# Patient Record
Sex: Male | Born: 1940 | Race: White | Hispanic: No | Smoking: Former smoker
Health system: Southern US, Community
[De-identification: ages and names within clinical notes are randomized; demographics above are authoritative.]

## PROBLEM LIST (undated history)

## (undated) DIAGNOSIS — Z992 Dependence on renal dialysis: Secondary | ICD-10-CM

## (undated) DIAGNOSIS — I10 Essential (primary) hypertension: Secondary | ICD-10-CM

## (undated) DIAGNOSIS — N19 Unspecified kidney failure: Secondary | ICD-10-CM

## (undated) DIAGNOSIS — I2510 Atherosclerotic heart disease of native coronary artery without angina pectoris: Secondary | ICD-10-CM

## (undated) HISTORY — PX: TONSILLECTOMY: SUR1361

## (undated) HISTORY — PX: BELOW KNEE LEG AMPUTATION: SUR23

---

## 2008-07-06 ENCOUNTER — Ambulatory Visit: Payer: Self-pay | Admitting: Diagnostic Radiology

## 2008-07-06 ENCOUNTER — Ambulatory Visit (HOSPITAL_BASED_OUTPATIENT_CLINIC_OR_DEPARTMENT_OTHER): Admission: RE | Admit: 2008-07-06 | Discharge: 2008-07-06 | Payer: Self-pay | Admitting: Family Medicine

## 2010-12-31 ENCOUNTER — Encounter: Payer: Self-pay | Admitting: *Deleted

## 2010-12-31 ENCOUNTER — Emergency Department (HOSPITAL_BASED_OUTPATIENT_CLINIC_OR_DEPARTMENT_OTHER)
Admission: EM | Admit: 2010-12-31 | Discharge: 2010-12-31 | Disposition: A | Discharge status: Home / Self Care | Payer: Medicare Other | Attending: Emergency Medicine | Admitting: Emergency Medicine

## 2010-12-31 DIAGNOSIS — E119 Type 2 diabetes mellitus without complications: Secondary | ICD-10-CM | POA: Insufficient documentation

## 2010-12-31 DIAGNOSIS — I10 Essential (primary) hypertension: Secondary | ICD-10-CM | POA: Insufficient documentation

## 2010-12-31 DIAGNOSIS — N179 Acute kidney failure, unspecified: Secondary | ICD-10-CM

## 2010-12-31 DIAGNOSIS — R944 Abnormal results of kidney function studies: Secondary | ICD-10-CM | POA: Insufficient documentation

## 2010-12-31 HISTORY — DX: Essential (primary) hypertension: I10

## 2010-12-31 HISTORY — DX: Unspecified kidney failure: N19

## 2010-12-31 NOTE — ED Provider Notes (Signed)
History     Chief Complaint  Patient presents with  . Abnormal Lab   HPI Comments: Pt was sent to ED for evaluation of creatine of 10.1.  Pt had a checkup with his MD and creatine was 10.1.  Pt does home peritoneal dialysis.  Pt reports he feels well.  No complaints.  Pt reports he has been shopping and has not had shortness of breath. Pt denies any swelling.  Pt reports no fever,  Fluid has been clear.  Patient is a 70 y.o. male presenting with general illness. The history is provided by the patient.  Illness  The current episode started today. Pertinent negatives include no abdominal pain, no nausea and no vomiting.    Past Medical History  Diagnosis Date  . Renal failure   . Diabetes mellitus   . Hypertension     History reviewed. No pertinent past surgical history.  History reviewed. No pertinent family history.  History  Substance Use Topics  . Smoking status: Never Smoker   . Smokeless tobacco: Not on file  . Alcohol Use: No      Review of Systems  Respiratory: Negative for shortness of breath.   Cardiovascular: Negative for chest pain and leg swelling.  Gastrointestinal: Negative for nausea, vomiting and abdominal pain.    Physical Exam  BP 156/79  Pulse 70  Temp(Src) 98.8 F (37.1 C) (Oral)  Resp 16  Wt 180 lb (81.647 kg)  SpO2 100%  Physical Exam  Constitutional: He is oriented to person, place, and time. He appears well-developed and well-nourished.  HENT:  Head: Normocephalic.  Cardiovascular: Normal rate.   Pulmonary/Chest: Effort normal.  Abdominal: Soft.  Musculoskeletal: Normal range of motion.  Neurological: He is alert and oriented to person, place, and time.  Skin: Skin is warm and dry.    ED Course  Procedures  MDM I spoke to the nephrologist on call for DrZehan  Who advised pt does not need repeat lab,  She advised have pt call her if any problems,       Langston Masker, [place] 12/31/10 1735

## 2010-12-31 NOTE — ED Notes (Signed)
Pt sent here from PMD office for increased creatine level 10.1, peritoneal dialysis pt

## 2011-11-12 ENCOUNTER — Encounter (HOSPITAL_BASED_OUTPATIENT_CLINIC_OR_DEPARTMENT_OTHER): Payer: Self-pay | Admitting: *Deleted

## 2011-11-12 ENCOUNTER — Emergency Department (HOSPITAL_BASED_OUTPATIENT_CLINIC_OR_DEPARTMENT_OTHER)
Admission: EM | Admit: 2011-11-12 | Discharge: 2011-11-12 | Disposition: A | Discharge status: Home / Self Care | Payer: Medicare Other | Attending: Emergency Medicine | Admitting: Emergency Medicine

## 2011-11-12 ENCOUNTER — Emergency Department (HOSPITAL_BASED_OUTPATIENT_CLINIC_OR_DEPARTMENT_OTHER): Payer: Medicare Other

## 2011-11-12 DIAGNOSIS — IMO0001 Reserved for inherently not codable concepts without codable children: Secondary | ICD-10-CM | POA: Insufficient documentation

## 2011-11-12 DIAGNOSIS — M778 Other enthesopathies, not elsewhere classified: Secondary | ICD-10-CM

## 2011-11-12 DIAGNOSIS — M79609 Pain in unspecified limb: Secondary | ICD-10-CM | POA: Insufficient documentation

## 2011-11-12 DIAGNOSIS — M7989 Other specified soft tissue disorders: Secondary | ICD-10-CM | POA: Insufficient documentation

## 2011-11-12 DIAGNOSIS — E119 Type 2 diabetes mellitus without complications: Secondary | ICD-10-CM | POA: Insufficient documentation

## 2011-11-12 DIAGNOSIS — M65849 Other synovitis and tenosynovitis, unspecified hand: Secondary | ICD-10-CM | POA: Insufficient documentation

## 2011-11-12 DIAGNOSIS — N289 Disorder of kidney and ureter, unspecified: Secondary | ICD-10-CM | POA: Insufficient documentation

## 2011-11-12 DIAGNOSIS — I10 Essential (primary) hypertension: Secondary | ICD-10-CM | POA: Insufficient documentation

## 2011-11-12 DIAGNOSIS — M65839 Other synovitis and tenosynovitis, unspecified forearm: Secondary | ICD-10-CM | POA: Insufficient documentation

## 2011-11-12 NOTE — ED Notes (Signed)
Woke with pain in his right hand. No known injury. Bruising noted.

## 2011-11-12 NOTE — ED Provider Notes (Signed)
History     CSN: [id number]  Arrival date & time 11/12/11  1423   First MD Initiated Contact with Patient 11/12/11 1556      No chief complaint on file.   (Consider location/radiation/quality/duration/timing/severity/associated sxs/prior treatment) Patient is a 71 y.o. male presenting with hand pain. The history is provided by the patient. No language interpreter was used.  Hand Pain This is a new problem. The current episode started today. The problem occurs constantly. The problem has been gradually worsening. Associated symptoms include joint swelling and myalgias. The symptoms are aggravated by nothing. He has tried nothing for the symptoms. The treatment provided moderate relief.  Pt complains of pain to his right hand and right thumb.  Past Medical History  Diagnosis Date  . Renal failure   . Diabetes mellitus   . Hypertension     History reviewed. No pertinent past surgical history.  No family history on file.  History  Substance Use Topics  . Smoking status: Never Smoker   . Smokeless tobacco: Not on file  . Alcohol Use: No      Review of Systems  Musculoskeletal: Positive for myalgias and joint swelling.  All other systems reviewed and are negative.    Allergies  Penicillins and Ramipril  Home Medications   Current Outpatient Rx  Name Route Sig Dispense Refill  . VITAMIN C 100 MG PO TABS Oral Take 100 mg by mouth every evening.      . ASPIRIN 81 MG PO TBEC Oral Take 81 mg by mouth every evening.      . ATORVASTATIN CALCIUM 80 MG PO TABS Oral Take 80 mg by mouth every evening.      Marland Kitchen CALCITRIOL 0.25 MCG PO CAPS Oral Take 0.25-0.5 mcg by mouth daily. Take 2 tabs Mon-Sat and take 1 tab on Sun     . CALCIUM CARBONATE ANTACID 1000 MG PO CHEW Oral Chew 1,000 mg by mouth 3 (three) times daily.      Marland Kitchen CLONIDINE HCL 0.3 MG/24HR TD PTWK Transdermal Place 1 patch onto the skin once a week. Change every Mon     . CLOPIDOGREL BISULFATE 75 MG PO TABS Oral Take 75  mg by mouth every evening.      Marland Kitchen DOXAZOSIN MESYLATE 2 MG PO TABS Oral Take 2 mg by mouth daily.      . OMEGA-3 FATTY ACIDS 1000 MG PO CAPS Oral Take 1 g by mouth 2 (two) times daily.      . FUROSEMIDE 40 MG PO TABS Oral Take 40 mg by mouth daily.      Marland Kitchen GLIPIZIDE ER 5 MG PO TB24 Oral Take 5 mg by mouth daily.      Marland Kitchen HYDRALAZINE HCL 25 MG PO TABS Oral Take 25 mg by mouth 3 (three) times daily.      . INSULIN GLARGINE 100 UNIT/ML [place] SOLN Subcutaneous Inject 20 Units into the skin at bedtime.      Marland Kitchen LABETALOL HCL 200 MG PO TABS Oral Take 200 mg by mouth 2 (two) times daily. Take 1 & 1/2 tabs in the morning and 1 tab in the evening     . MULTIVITAL PO Oral Take 1 tablet by mouth daily.      Marland Kitchen VITAMIN E 400 UNITS PO CAPS Oral Take 400 Units by mouth every evening.      Marland Kitchen ZINC 50 MG PO CAPS Oral Take 1 capsule by mouth every evening.        BP  166/74  Pulse 78  Temp(Src) 98.3 F (36.8 C) (Oral)  Resp 20  Ht 5\' 4"  (1.626 m)  Wt 178 lb (80.74 kg)  BMI 30.55 kg/m2  SpO2 100%  Physical Exam  Nursing note and vitals reviewed. Constitutional: He is oriented to person, place, and time. He appears well-developed and well-nourished.  HENT:  Head: Normocephalic and atraumatic.  Musculoskeletal: He exhibits tenderness.       Tender right thumb,  Pain with loading,  nv and ns intact  Neurological: He is alert and oriented to person, place, and time. He has normal reflexes.  Skin: Skin is warm and dry.  Psychiatric: He has a normal mood and affect.    ED Course  Procedures (including critical care time)  Labs Reviewed - No data to display Dg Hand Complete Right  11/12/2011  *RADIOLOGY REPORT*  Clinical Data: Right hand pain, no known injury, history renal failure, diabetes  RIGHT HAND - COMPLETE 3+ VIEW  Comparison: None  Findings: Osseous demineralization. Scattered narrowing of interphalangeal joints. Mild degenerative changes at first carpal-metacarpal joint and at scaphotrapezium joint.  Scattered small vessel vascular calcifications question related to renal failure and diabetes. Small sclerotic focus within the distal right ulnar diaphysis question enchondroma. No acute fracture or dislocation. Small juxta-articular erosion at fourth MCP joint.  IMPRESSION: Osseous demineralization with scattered degenerative changes of interphalangeal joints at radial border of carpus. Single small erosion identified at fourth MCP joint, cannot exclude coexistent inflammatory arthropathy. No other focal bony abnormalities seen.  Original Report Authenticated By: Lollie Marrow, M.D.     No diagnosis found.    MDM  Pt placed in thumb spica splint        Lonia Skinner [place], [place] 11/12/11 1650

## 2011-11-12 NOTE — Discharge Instructions (Signed)
Extensor Carpi Ulnaris Tendinitis with Rehab Tendonitis involves inflammation of a tendon, which is a soft tissue that connects muscle to bone. The Extensor Carpi Ulnaris (ECU) tendon is vulnerable to tendonitis. The ECU is responsible for straightening and rotating (abducting) the wrist. The ECU is important for gripping and pulling. ECU tendonitis may include inflammation of the ECU tendon lining (sheath). The tendon sheath usually secretes a fluid that allows the tendon to function smoothly, but when it becomes inflamed, function is impaired. This condition may also include a tear in the ECU tendon or muscle (strain). The strain may be classified as a grade 1 or 2 strain. Grade 1 strains cause pain, but the tendon is not lengthened. Grade 2 strains include a lengthened ligament, due to being stretched or partially ruptured. With grade 2 strains there is still function, although the function may be reduced. SYMPTOMS   Pain, tenderness, swelling, warmth, or redness on the little finger side of the wrist.   Pain that worsens when straightening the wrist or bending it toward the little finger.   Pain with gripping.   Limited motion of the wrist.   Crackling sound (crepitation) when the tendon or wrist is moved or touched.  CAUSES  ECU tendonitis is caused by injury to the ECU tendon. It is usually due to chronic or repetitive injuries, but may be due to sharp (acute) injury. Common causes of injury include:  Strain from unusual use, overuse, or increase in activity, or change in activity of the wrist, hand, or forearm.   Direct hit (trauma) to the muscles and tendon on the side of the wrist.   Repetitive motions of the hand and wrist, due to friction of the tendon within the tendon lining.  RISK INCREASES WITH:  Sports that involve repetitive hand and wrist motions (i.e. golfing, bowling).   Sports that require gripping (i.e. tennis, golf, weightlifting).   Heavy labor.   Poor wrist and  forearm strength and flexibility.   Failure to warm-up properly before activity.  PREVENTION   Warm up and stretch properly before activity.   Allow the body to recover between activities.   Maintain physical fitness:   Strength, flexibility, and endurance.   Cardiovascular fitness.   Learn and use proper exercise technique.  PROGNOSIS  If treated properly, ECU tendonitis is usually curable within 6 weeks.  RELATED COMPLICATIONS   Longer healing time, if not properly treated or if not given adequate time to heal.   Chronically inflamed tendon, causing persistent pain with activity. This may progress to constant pain, restriction of motion, and potentially tearing of the tendon.   Recurring symptoms, especially if activity is resumed too soon.   Risks of surgery: infection, bleeding, injury to nerves, continued pain, incomplete release of the tendon lining, recurring symptoms, cutting of the tendon, and weakness of the wrist and grip.  TREATMENT  Treatment initially involves the use of ice and medicine, to help reduce pain and inflammation. Performing stretching and strengthening exercises regularly is important for a quick recovery. These exercises may be completed at home or with a therapist. Your caregiver may recommend the use of a brace or splint to reduce motions that aggravate symptoms. Corticosteroid injections may be recommended. If non-surgical treatment is unsuccessful, then surgery may be needed.  MEDICATION   If pain medicine is needed, nonsteroidal anti-inflammatory medicines (aspirin and ibuprofen), or other minor pain relievers (acetaminophen), are often recommended.   Do not take pain medicine for 7 days before surgery.     Prescription pain relievers may be given if your caregiver thinks they are needed. Use only as directed and only as much as you need.   Corticosteroid injections may be given to reduce inflammation. However, these injections should be reserved  for serious cases, as they may only be given a certain number of times.  COLD THERAPY  Cold treatment (icing) relieves pain and reduces inflammation. Cold treatment should be applied for 10 to 15 minutes every 2 to 3 hours, and immediately after activity that aggravates your symptoms. Use ice packs or an ice massage. SEEK MEDICAL CARE IF:   Symptoms get worse or do not improve in 2 weeks, despite treatment.   You experience pain, numbness, or coldness in the hand.   Blue, gray, or dark color appears in the fingernails.   Any of the following occur after surgery: increased pain, swelling, redness, drainage of fluids, bleeding in the surgical area, or signs of infection.   New, unexplained symptoms develop. (Drugs used in treatment may produce side effects.)  EXERCISES RANGE OF MOTION (ROM) AND STRETCHING EXERCISES - Extensor Carpi Ulnaris Tendinitis These are some of the initial exercises you may start your recovery program with, until you see your caregiver again or until your symptoms are resolved. Remember:  Flexible tissue is more tolerant of the stresses placed on it during activity.   Each stretch should be held for 20 to 30 seconds.   A gentle stretching sensation should be felt.  RANGE OF MOTION - Wrist Flexion  Hold your right / left wrist with the fingers pointing down toward the floor.   Pull down on the wrist until you feel a stretch.   Hold this position for __________ seconds. Repeat exercise __________ times, __________ times per day.   This exercise should be done with the elbow:  _____ Bent to 90 degrees. _____ Straight. RANGE OF MOTION - Wrist Flexion  Place the back of your right / left hand flat on the top of a table. Your shoulder should be turned in and your fingers facing away from your body.   Press down, bending your wrist and straightening your elbow until your feel a stretch.   Hold this position for __________ seconds.   Repeat exercise __________  times, __________ times per day.  STRENGTHENING EXERCISES - Extensor Carpi Ulnaris Tendinitis These are some of the initial exercises you may start your recovery program with, until you see your caregiver again or until your symptoms are resolved. Remember:  Strong muscles with good endurance tolerate stress better.   Do the exercises as initially prescribed by your caregiver. Progress slowly with each exercise, gradually increasing the number of repetitions and weight used, only as guided.  RANGE OF MOTION - Wrist Extensors  Sit or stand with your forearm supported.   Using a __________ weight or a piece of rubber band or tubing, bend your wrist slowly upward toward you.   Hold this position for __________ seconds and then slowly lower the wrist back to the starting position.   Repeat exercise __________ times, __________ times per day.  RANGE OF MOTION - Wrist, Ulnar Deviation  Stand with a hammer in your hand, or sit while holding onto a rubber band or tubing with both hands, and with your arm supported on a table.   Raise your hand with the hammer upward behind you, or pull down on the rubber tubing.   Hold this position for __________ seconds and then slowly lower the wrist back to   the starting position.   Repeat exercise __________ times, __________ times per day.  STRENGTH - Grip  Hold a wad of putty, soft modeling clay, a large sponge, a soft rubber ball or a soft tennis ball in your hand.   Squeeze as hard as you can.   Hold this position for __________ seconds.   Repeat exercise __________ times, __________ times per day.  Document Released: 06/08/2005 Document Revised: 05/28/2011 Document Reviewed: 09/20/2008 [employer] Patient Information 2012 [employer]. 

## 2011-11-13 NOTE — ED Provider Notes (Signed)
Medical screening examination/treatment/procedure(s) were performed by non-physician practitioner and as supervising physician I was immediately available for consultation/collaboration.   Nat Christen, MD 11/13/11 0005

## 2016-06-12 ENCOUNTER — Emergency Department (HOSPITAL_BASED_OUTPATIENT_CLINIC_OR_DEPARTMENT_OTHER)
Admission: EM | Admit: 2016-06-12 | Discharge: 2016-06-12 | Disposition: A | Discharge status: Other Acute Inpatient Hospital | Payer: Medicare Other | Attending: Emergency Medicine | Admitting: Emergency Medicine

## 2016-06-12 ENCOUNTER — Encounter (HOSPITAL_BASED_OUTPATIENT_CLINIC_OR_DEPARTMENT_OTHER): Payer: Self-pay | Admitting: *Deleted

## 2016-06-12 ENCOUNTER — Emergency Department (HOSPITAL_BASED_OUTPATIENT_CLINIC_OR_DEPARTMENT_OTHER): Payer: Medicare Other

## 2016-06-12 DIAGNOSIS — S79922A Unspecified injury of left thigh, initial encounter: Secondary | ICD-10-CM | POA: Diagnosis present

## 2016-06-12 DIAGNOSIS — Z79899 Other long term (current) drug therapy: Secondary | ICD-10-CM | POA: Diagnosis not present

## 2016-06-12 DIAGNOSIS — Z87891 Personal history of nicotine dependence: Secondary | ICD-10-CM | POA: Insufficient documentation

## 2016-06-12 DIAGNOSIS — Z992 Dependence on renal dialysis: Secondary | ICD-10-CM | POA: Diagnosis not present

## 2016-06-12 DIAGNOSIS — E1122 Type 2 diabetes mellitus with diabetic chronic kidney disease: Secondary | ICD-10-CM | POA: Insufficient documentation

## 2016-06-12 DIAGNOSIS — S72002A Fracture of unspecified part of neck of left femur, initial encounter for closed fracture: Secondary | ICD-10-CM | POA: Diagnosis not present

## 2016-06-12 DIAGNOSIS — Z794 Long term (current) use of insulin: Secondary | ICD-10-CM | POA: Diagnosis not present

## 2016-06-12 DIAGNOSIS — Y9389 Activity, other specified: Secondary | ICD-10-CM | POA: Diagnosis not present

## 2016-06-12 DIAGNOSIS — Z7982 Long term (current) use of aspirin: Secondary | ICD-10-CM | POA: Insufficient documentation

## 2016-06-12 DIAGNOSIS — W1839XA Other fall on same level, initial encounter: Secondary | ICD-10-CM | POA: Diagnosis not present

## 2016-06-12 DIAGNOSIS — Y999 Unspecified external cause status: Secondary | ICD-10-CM | POA: Diagnosis not present

## 2016-06-12 DIAGNOSIS — Y929 Unspecified place or not applicable: Secondary | ICD-10-CM | POA: Diagnosis not present

## 2016-06-12 DIAGNOSIS — N186 End stage renal disease: Secondary | ICD-10-CM | POA: Diagnosis not present

## 2016-06-12 DIAGNOSIS — I120 Hypertensive chronic kidney disease with stage 5 chronic kidney disease or end stage renal disease: Secondary | ICD-10-CM | POA: Diagnosis not present

## 2016-06-12 HISTORY — DX: Dependence on renal dialysis: Z99.2

## 2016-06-12 LAB — CBC WITH DIFFERENTIAL/PLATELET
BASOS ABS: 0.1 10*3/uL (ref 0.0–0.1)
Basophils Relative: 1 %
Eosinophils Absolute: 1.6 10*3/uL — ABNORMAL HIGH (ref 0.0–0.7)
Eosinophils Relative: 18 %
HEMATOCRIT: 32 % — AB (ref 39.0–52.0)
HEMOGLOBIN: 10.4 g/dL — AB (ref 13.0–17.0)
LYMPHS PCT: 10 %
Lymphs Abs: 0.9 10*3/uL (ref 0.7–4.0)
MCH: 30.7 pg (ref 26.0–34.0)
MCHC: 32.5 g/dL (ref 30.0–36.0)
MCV: 94.4 fL (ref 78.0–100.0)
MONO ABS: 0.6 10*3/uL (ref 0.1–1.0)
Monocytes Relative: 7 %
NEUTROS ABS: 5.8 10*3/uL (ref 1.7–7.7)
NEUTROS PCT: 64 %
Platelets: 201 10*3/uL (ref 150–400)
RBC: 3.39 MIL/uL — AB (ref 4.22–5.81)
RDW: 14.1 % (ref 11.5–15.5)
WBC: 9 10*3/uL (ref 4.0–10.5)

## 2016-06-12 LAB — BASIC METABOLIC PANEL
ANION GAP: 12 (ref 5–15)
BUN: 16 mg/dL (ref 6–20)
CO2: 31 mmol/L (ref 22–32)
Calcium: 8.9 mg/dL (ref 8.9–10.3)
Chloride: 94 mmol/L — ABNORMAL LOW (ref 101–111)
Creatinine, Ser: 3.78 mg/dL — ABNORMAL HIGH (ref 0.61–1.24)
GFR calc Af Amer: 17 mL/min — ABNORMAL LOW (ref >60)
GFR, EST NON AFRICAN AMERICAN: 15 mL/min — AB (ref >60)
GLUCOSE: 85 mg/dL (ref 65–99)
POTASSIUM: 3.4 mmol/L — AB (ref 3.5–5.1)
Sodium: 137 mmol/L (ref 135–145)

## 2016-06-12 LAB — PROTIME-INR
INR: 1.11
Prothrombin Time: 14.3 seconds (ref 11.4–15.2)

## 2016-06-12 MED ORDER — HYDROMORPHONE HCL 1 MG/ML IJ SOLN
0.5000 mg | INTRAMUSCULAR | Status: DC | PRN
  Filled 2016-06-12: qty 1

## 2016-06-12 MED ORDER — MORPHINE SULFATE (PF) 4 MG/ML IV SOLN
4.0000 mg | INTRAVENOUS | Status: DC | PRN
  Administered 2016-06-12: 4 mg via INTRAVENOUS
  Filled 2016-06-12: qty 1

## 2016-06-12 NOTE — ED Provider Notes (Signed)
[facility]   CSN: [id number] Arrival date & time: 06/12/16  1350     History   Chief Complaint Chief Complaint  Patient presents with  . Fall    HPI Todd Coleman is a 75 y.o. male.  HPI  75 -year-old male with a history of diabetes, hypertension, CAD with stent, and end-stage renal disease on dialysis presents with left leg pain after a fall 2 days ago. He was using his walker and stood up and lost his balance and fell backwards. He landed on his left leg. He has walked once since then and had a very hard time. Anytime he tries to get up it seems to hurt worse. Better at rest. Otherwise he has been using his scooter. 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 Pain is okay at this time, last pain medicine this morning and it was Tylenol. No other injuries. Last did dialysis today.  Past Medical History:  Diagnosis Date  . Diabetes mellitus   . Dialysis patient (HCC)   . Hypertension   . Renal failure     There are no active problems to display for this patient.   Past Surgical History:  Procedure Laterality Date  . BELOW KNEE LEG AMPUTATION    . CARDIAC CATHETERIZATION    . TONSILLECTOMY         Home Medications    Prior to Admission medications   Medication Sig Start Date End Date Taking? Authorizing Provider  Ascorbic Acid (VITAMIN C) 100 MG tablet Take 100 mg by mouth every evening.      Historical Provider, MD  aspirin 81 MG EC tablet Take 81 mg by mouth every evening.      Historical Provider, MD  atorvastatin (LIPITOR) 80 MG tablet Take 80 mg by mouth every evening.      Historical Provider, MD  calcitRIOL (ROCALTROL) 0.25 MCG capsule Take 0.25-0.5 mcg by mouth daily. Take 2 tabs Mon-Sat and take 1 tab on Sun     Historical Provider, MD  calcium elemental as carbonate (TUMS ULTRA 1000) 400 MG tablet Chew 1,000 mg by mouth 3 (three) times daily.      Historical Provider, MD  cloNIDine (CATAPRES - DOSED IN MG/24 HR) 0.3  mg/24hr Place 1 patch onto the skin once a week. Change every Mon     Historical Provider, MD  clopidogrel (PLAVIX) 75 MG tablet Take 75 mg by mouth every evening.      Historical Provider, MD  doxazosin (CARDURA) 2 MG tablet Take 2 mg by mouth daily.      Historical Provider, MD  fish oil-omega-3 fatty acids 1000 MG capsule Take 1 g by mouth 2 (two) times daily.      Historical Provider, MD  furosemide (LASIX) 40 MG tablet Take 40 mg by mouth daily.      Historical Provider, MD  glipiZIDE (GLUCOTROL XL) 5 MG 24 hr tablet Take 5 mg by mouth daily.      Historical Provider, MD  hydrALAZINE (APRESOLINE) 25 MG tablet Take 25 mg by mouth 3 (three) times daily.      Historical Provider, MD  insulin glargine (LANTUS SOLOSTAR) 100 UNIT/ML injection Inject 20 Units into the skin at bedtime.      Historical Provider, MD  labetalol (NORMODYNE) 200 MG tablet Take 200 mg by mouth 2 (two) times daily. Take 1 & 1/2 tabs in the morning and 1 tab in the evening     Historical Provider, MD  Multiple  Vitamins-Minerals (MULTIVITAL PO) Take 1 tablet by mouth daily.      Historical Provider, MD  vitamin E 400 UNIT capsule Take 400 Units by mouth every evening.      Historical Provider, MD  Zinc 50 MG CAPS Take 1 capsule by mouth every evening.      Historical Provider, MD    Family History No family history on file.  Social History Social History  Substance Use Topics  . Smoking status: Former Games developer  . Smokeless tobacco: Not on file  . Alcohol use No     Allergies   Dilaudid [hydromorphone hcl]; Penicillins; and Ramipril   Review of Systems Review of Systems  Respiratory: Negative for shortness of breath.   Cardiovascular: Negative for chest pain.  Musculoskeletal: Positive for arthralgias.  All other systems reviewed and are negative.    Physical Exam Updated Vital Signs BP 168/69 (BP Location: Right Arm)   Pulse 73   Temp 97.7 F (36.5 C)   Resp 17   Ht 5\' 6"  (1.676 m)   Wt 180 lb (81.6  kg)   SpO2 96%   BMI 29.05 kg/m   Physical Exam  Constitutional: He is oriented to person, place, and time. He appears well-developed and well-nourished.  HENT:  Head: Normocephalic and atraumatic.  Right Ear: External ear normal.  Left Ear: External ear normal.  Nose: Nose normal.  Eyes: Right eye exhibits no discharge. Left eye exhibits no discharge.  Neck: Neck supple.  Cardiovascular: Normal rate, regular rhythm and normal heart sounds.   Pulmonary/Chest: Effort normal and breath sounds normal.  Abdominal: He exhibits no distension.  Musculoskeletal: He exhibits no edema.       Left hip: He exhibits decreased range of motion and tenderness.  Bilateral AKA Mild left hip tenderness, most pain is when doing ROM of left hip  Neurological: He is alert and oriented to person, place, and time.  Skin: Skin is warm and dry.  Nursing note and vitals reviewed.    ED Treatments / Results  Labs (all labs ordered are listed, but only abnormal results are displayed) Labs Reviewed  BASIC METABOLIC PANEL - Abnormal; Notable for the following:       Result Value   Potassium 3.4 (*)    Chloride 94 (*)    Creatinine, Ser 3.78 (*)    GFR calc non Af Amer 15 (*)    GFR calc Af Amer 17 (*)    All other components within normal limits  CBC WITH DIFFERENTIAL/PLATELET - Abnormal; Notable for the following:    RBC 3.39 (*)    Hemoglobin 10.4 (*)    HCT 32.0 (*)    Eosinophils Absolute 1.6 (*)    All other components within normal limits  PROTIME-INR    EKG  EKG Interpretation None       Radiology Dg Hip Unilat W Or Wo Pelvis 1 View Left  Result Date: 06/12/2016 CLINICAL DATA:  Fall. EXAM: DG HIP (WITH OR WITHOUT PELVIS) 1V*L* COMPARISON:  Prior CT report 09/28/2011. FINDINGS: Diffuse osteopenia degenerative change. Left femoral neck fracture is noted. Minimal displacement. Aortoiliac atherosclerotic vascular calcification. Surgical clips noted throughout the pelvis. Calcified  granulomas noted . IMPRESSION: Slightly displaced left femoral neck fracture. Electronically Signed   By: Maisie Fus  Register   On: 06/12/2016 15:11    Procedures Procedures (including critical care time)  Medications Ordered in ED Medications  morphine 4 MG/ML injection 4 mg (4 mg Intravenous Given 06/12/16 1557)  Initial Impression / Assessment and Plan / ED Course  I have reviewed the triage vital signs and the nursing notes.  Pertinent labs & imaging results that were available during my care of the patient were reviewed by me and considered in my medical decision making (see chart for details).  Clinical Course as of Jun 13 1743  Fri Jun 12, 2016  1536 Consult ortho at [facility] per patient preference for his left hip fracture.  [SG]  1549 D/[facility]Lucas[facility] He will work on finding out about beds at the hospital and call back, likely transfer patient to ER per his preference  [SG]  1600 Dr. Doylene CanningGold, hospitalist[facility] will call back with bed  [SG]    Clinical Course User Index [SG] Pricilla LovelessScott Reeve Mallo, MD    Final Clinical Impressions(s) / ED Diagnoses   Final diagnoses:  Closed displaced fracture of left femoral neck [facility](HCC)    New Prescriptions Discharge Medication List as of 06/12/2016  5:40 PM       Pricilla LovelessScott Myalynn Lingle, MD 06/12/16 1744

## 2016-06-12 NOTE — ED Notes (Signed)
ED Provider at bedside. 

## 2016-06-12 NOTE — ED Triage Notes (Signed)
Pt c/o from standing on tile floor x 3 days ago , c/o left hip pain

## 2016-06-12 NOTE — ED Notes (Signed)
Oxygen saturation 88% on RA.  Patient placed on 2L via [place]-oxygen saturation @94 %

## 2016-06-12 NOTE — ED Triage Notes (Signed)
Nurse first-pt was assisted from front car seat to w/c to ED WR-pt was able to stand on bilat prostheses-NAD

## 2016-08-01 ENCOUNTER — Emergency Department (HOSPITAL_BASED_OUTPATIENT_CLINIC_OR_DEPARTMENT_OTHER)
Admission: EM | Admit: 2016-08-01 | Discharge: 2016-08-01 | Disposition: A | Discharge status: Other Acute Inpatient Hospital | Payer: Medicare Other | Attending: Emergency Medicine | Admitting: Emergency Medicine

## 2016-08-01 ENCOUNTER — Emergency Department (HOSPITAL_BASED_OUTPATIENT_CLINIC_OR_DEPARTMENT_OTHER): Payer: Medicare Other

## 2016-08-01 ENCOUNTER — Encounter (HOSPITAL_BASED_OUTPATIENT_CLINIC_OR_DEPARTMENT_OTHER): Payer: Self-pay | Admitting: *Deleted

## 2016-08-01 DIAGNOSIS — Z7982 Long term (current) use of aspirin: Secondary | ICD-10-CM | POA: Insufficient documentation

## 2016-08-01 DIAGNOSIS — Z794 Long term (current) use of insulin: Secondary | ICD-10-CM | POA: Insufficient documentation

## 2016-08-01 DIAGNOSIS — E119 Type 2 diabetes mellitus without complications: Secondary | ICD-10-CM | POA: Diagnosis not present

## 2016-08-01 DIAGNOSIS — I10 Essential (primary) hypertension: Secondary | ICD-10-CM | POA: Diagnosis not present

## 2016-08-01 DIAGNOSIS — Z87891 Personal history of nicotine dependence: Secondary | ICD-10-CM | POA: Diagnosis not present

## 2016-08-01 DIAGNOSIS — Z79899 Other long term (current) drug therapy: Secondary | ICD-10-CM | POA: Insufficient documentation

## 2016-08-01 DIAGNOSIS — R251 Tremor, unspecified: Secondary | ICD-10-CM | POA: Diagnosis present

## 2016-08-01 DIAGNOSIS — I639 Cerebral infarction, unspecified: Secondary | ICD-10-CM | POA: Diagnosis not present

## 2016-08-01 LAB — CBG MONITORING, ED
GLUCOSE-CAPILLARY: 118 mg/dL — AB (ref 65–99)
Glucose-Capillary: 83 mg/dL (ref 65–99)

## 2016-08-01 NOTE — ED Triage Notes (Addendum)
Pt from independent living facility.  States that he woke up at 4am after having a 'weird dream'.  States that his speech was slurred at that time.  Reports slight dizziness that continues at this time.  EMS was called, told pt that he was having a TIA.  Pt refused transport at that time.  14units on novolog taken at 3:45pm-for CBG of 167.  Pt is noted to be falling asleep and slightly delayed responses in triage.

## 2016-08-01 NOTE — ED Notes (Signed)
ED Provider at bedside. 

## 2016-08-01 NOTE — ED Notes (Signed)
[facility] Nurse unavailable -- left name and number with unit secretary.

## 2016-08-01 NOTE — ED Provider Notes (Signed)
[facility] Provider Note   CSN: [id number] Arrival date & time: 08/01/16  1608   By signing my name below, I, Soijett Blue, attest that this documentation has been prepared under the direction and in the presence of Rolland Porter, MD. Electronically Signed: Soijett Blue, ED Scribe. 08/01/16. 5:00 PM.  History   Chief Complaint Chief Complaint  Patient presents with  . Transient Ischemic Attack    HPI Todd Coleman is a 76 y.o. male with a PMHx of DM, HTN, dialysis patient, renal dx, who presents to the Emergency Department from [facility] complaining of possible TIA onset 4 AM this morning. Pt reports associated resolved confusion, speech difficulty, and tremors. Pt hasn't tried any medications for the relief of her symptoms. Pt notes that he woke up this morning with slurred speech. Pt reports that EMS came to evaluate the pt and informed that the pt would need to be evaluated to rule out a possible stroke, but the pt declined transport. Pt reports that his blood sugar was 71 earlier today, but was 167 PTA and he took 13 units of novolog with a meal. He denies weakness, HA, fever, and any other symptoms. Denies hx of stroke. 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 Pt notes that he had a 50 year smoking hx and quit smoking cigarettes 10 years ago.    The history is provided by the patient. No language interpreter was used.    Past Medical History:  Diagnosis Date  . Diabetes mellitus   . Dialysis patient (HCC)   . Hypertension   . Renal failure     There are no active problems to display for this patient.   Past Surgical History:  Procedure Laterality Date  . BELOW KNEE LEG AMPUTATION    . CARDIAC CATHETERIZATION    . TONSILLECTOMY         Home Medications    Prior to Admission medications   Medication Sig Start  Date End Date Taking? Authorizing Provider  torsemide (DEMADEX) 20 MG tablet Take 40 mg by mouth daily.   Yes Historical Provider, MD  Ascorbic Acid (VITAMIN C) 100 MG tablet Take 100 mg by mouth every evening.      Historical Provider, MD  aspirin 81 MG EC tablet Take 81 mg by mouth every evening.      Historical Provider, MD  atorvastatin (LIPITOR) 80 MG tablet Take 80 mg by mouth every evening.      Historical Provider, MD  calcitRIOL (ROCALTROL) 0.25 MCG capsule Take 0.25-0.5 mcg by mouth daily. Take 2 tabs Mon-Sat and take 1 tab on Sun     Historical Provider, MD  calcium elemental as carbonate (TUMS ULTRA 1000) 400 MG tablet Chew 1,000 mg by mouth 3 (three) times daily.      Historical Provider, MD  cloNIDine (CATAPRES - DOSED IN MG/24 HR) 0.3 mg/24hr Place 1 patch onto the skin once a week. Change every Mon     Historical Provider, MD  clopidogrel (PLAVIX) 75 MG tablet Take 75 mg by mouth every evening.      Historical Provider, MD  doxazosin (CARDURA) 2 MG tablet Take 2 mg by mouth daily.      Historical Provider, MD  fish oil-omega-3 fatty acids 1000 MG capsule Take 1 g by mouth 2 (two) times daily.  Historical Provider, MD  glipiZIDE (GLUCOTROL XL) 5 MG 24 hr tablet Take 5 mg by mouth daily.      Historical Provider, MD  hydrALAZINE (APRESOLINE) 25 MG tablet Take 25 mg by mouth 3 (three) times daily.      Historical Provider, MD  insulin glargine (LANTUS SOLOSTAR) 100 UNIT/ML injection Inject 20 Units into the skin at bedtime.      Historical Provider, MD  labetalol (NORMODYNE) 200 MG tablet Take 200 mg by mouth 2 (two) times daily. Take 1 & 1/2 tabs in the morning and 1 tab in the evening     Historical Provider, MD  Multiple Vitamins-Minerals (MULTIVITAL PO) Take 1 tablet by mouth daily.      Historical Provider, MD  vitamin E 400 UNIT capsule Take 400 Units by mouth every evening.      Historical Provider, MD  Zinc 50 MG CAPS Take 1 capsule by mouth every evening.      Historical  Provider, MD    Family History History reviewed. No pertinent family history.  Social History Social History  Substance Use Topics  . Smoking status: Former Games developer  . Smokeless tobacco: Not on file  . Alcohol use No     Allergies   Dilaudid [hydromorphone hcl]; Penicillins; and Ramipril   Review of Systems Review of Systems  Constitutional: Negative for appetite change, chills, diaphoresis, fatigue and fever.  HENT: Negative for mouth sores, sore throat and trouble swallowing.   Eyes: Negative for visual disturbance.  Respiratory: Negative for cough, chest tightness, shortness of breath and wheezing.   Cardiovascular: Negative for chest pain.  Gastrointestinal: Negative for abdominal distention, abdominal pain, diarrhea, nausea and vomiting.  Endocrine: Negative for polydipsia, polyphagia and polyuria.  Genitourinary: Negative for dysuria, frequency and hematuria.  Musculoskeletal: Negative for gait problem.  Skin: Negative for color change, pallor and rash.  Neurological: Positive for tremors and speech difficulty. Negative for dizziness, syncope, weakness, light-headedness and headaches.  Hematological: Does not bruise/bleed easily.  Psychiatric/Behavioral: Positive for confusion (resolved). Negative for behavioral problems.     Physical Exam Updated Vital Signs BP 169/77 (BP Location: Right Arm)   Pulse 87   Temp 98.1 F (36.7 C) (Oral)   Resp 24   Ht 5\' 5"  (1.651 m)   Wt 190 lb (86.2 kg)   SpO2 100%   BMI 31.62 kg/m   Physical Exam  Constitutional: He is oriented to person, place, and time. He appears well-developed and well-nourished. No distress.  HENT:  Head: Normocephalic.  Eyes: Conjunctivae are normal. Pupils are equal, round, and reactive to light. No scleral icterus.  Neck: Normal range of motion. Neck supple. No thyromegaly present.  Cardiovascular: Normal rate, regular rhythm and normal heart sounds.  Exam reveals no gallop and no friction rub.     No murmur heard. Sinus rhythm on monitor  Pulmonary/Chest: Effort normal and breath sounds normal. No respiratory distress. He has no wheezes. He has no rales.  Abdominal: Soft. Bowel sounds are normal. He exhibits no distension. There is no tenderness. There is no rebound.  Musculoskeletal: Normal range of motion.  Bilateral AKA  Neurological: He is alert and oriented to person, place, and time.  Dysarthria.  Cranial nerves otherwise intact. Resting tremor, BUE No pronator drift. Normal BUE sensation. Normal finger to nose. Bilateral LE amputations-able to lift against gravity.  Skin: Skin is warm and dry. No rash noted.  Psychiatric: He has a normal mood and affect. His behavior is normal.  Nursing  note and vitals reviewed.    ED Treatments / Results  DIAGNOSTIC STUDIES: Oxygen Saturation is 100% on RA, nl by my interpretation.    COORDINATION OF CARE: 4:57 PM Discussed treatment plan with pt at bedside which includes CBG, CT head, EKG, and pt agreed to plan.  5:08 PM- Consult with Dr. Pola Cornahal from [facility], who will accept the pt for admission.   Labs (all labs ordered are listed, but only abnormal results are displayed) Labs Reviewed  CBG MONITORING, ED - Abnormal; Notable for the following:       Result Value   Glucose-Capillary 118 (*)    All other components within normal limits    EKG  EKG Interpretation None       Radiology Ct Head Wo Contrast  Result Date: 08/01/2016 CLINICAL DATA:  Pt from independent living facility. States that he woke up at 4am after having a 'weird dream'. States that his speech was slurred at that time. Reports slight dizziness that continues at this time. EMS was called, told pt t.*comment was truncated* EXAM: CT HEAD WITHOUT CONTRAST TECHNIQUE: Contiguous axial images were obtained from the base of the skull through the vertex without intravenous contrast. COMPARISON:  None. FINDINGS: Brain: No acute intracranial  hemorrhage. No focal mass lesion. No CT evidence of acute infarction. No midline shift or mass effect. No hydrocephalus. Basilar cisterns are patent. There are periventricular and subcortical white matter hypodensities. Generalized cortical atrophy. Vascular: No hyperdense vessel or unexpected calcification. Skull: Normal. Negative for fracture or focal lesion. Sinuses/Orbits: Mild sinus inflammation of the RIGHT maxillary sinus. Other: None. IMPRESSION: IMPRESSION 1. No acute intracranial findings. 2. Atrophy and white matter microvascular disease. Electronically Signed   By: Genevive BiStewart  Edmunds M.D.   On: 08/01/2016 16:47    Procedures Procedures (including critical care time)  Medications Ordered in ED Medications - No data to display   Initial Impression / Assessment and Plan / ED Course  I have reviewed the triage vital signs and the nursing notes.  Pertinent labs & imaging results that were available during my care of the patient were reviewed by me and considered in my medical decision making (see chart for details).     She was subtle but persistent dysarthria. Patient has history of cardiovascular, renal vascular, and peripheral vascular disease. Likely cerebrovascular accident today. Normal CT. Discussed with hospitalist at [facility], Dr.Dahal.  Patient accepted for transfer and admission there for further workup for CVA.  Final Clinical Impressions(s) / ED Diagnoses   Final diagnoses:  Cerebrovascular accident (CVA), unspecified mechanism (HCC)    New Prescriptions New Prescriptions   No medications on file   I personally performed the services described in this documentation, which was scribed in my presence. The recorded information has been reviewed and is accurate.     Rolland PorterMark Deangleo Passage, MD 08/01/16 1725

## 2016-08-01 NOTE — ED Notes (Signed)
Patient transported to CT 

## 2017-08-11 ENCOUNTER — Other Ambulatory Visit: Payer: Self-pay

## 2017-08-11 ENCOUNTER — Emergency Department (HOSPITAL_COMMUNITY)
Admission: EM | Admit: 2017-08-11 | Discharge: 2017-08-12 | Disposition: A | Discharge status: Home / Self Care | Payer: Medicare Other | Attending: Emergency Medicine | Admitting: Emergency Medicine

## 2017-08-11 DIAGNOSIS — E1122 Type 2 diabetes mellitus with diabetic chronic kidney disease: Secondary | ICD-10-CM | POA: Insufficient documentation

## 2017-08-11 DIAGNOSIS — Z4901 Encounter for fitting and adjustment of extracorporeal dialysis catheter: Secondary | ICD-10-CM | POA: Diagnosis present

## 2017-08-11 DIAGNOSIS — Z794 Long term (current) use of insulin: Secondary | ICD-10-CM | POA: Insufficient documentation

## 2017-08-11 DIAGNOSIS — N186 End stage renal disease: Secondary | ICD-10-CM | POA: Insufficient documentation

## 2017-08-11 DIAGNOSIS — Z992 Dependence on renal dialysis: Secondary | ICD-10-CM | POA: Diagnosis not present

## 2017-08-11 DIAGNOSIS — Z79899 Other long term (current) drug therapy: Secondary | ICD-10-CM | POA: Diagnosis not present

## 2017-08-11 DIAGNOSIS — Z87891 Personal history of nicotine dependence: Secondary | ICD-10-CM | POA: Insufficient documentation

## 2017-08-11 DIAGNOSIS — R58 Hemorrhage, not elsewhere classified: Secondary | ICD-10-CM

## 2017-08-11 DIAGNOSIS — Z7901 Long term (current) use of anticoagulants: Secondary | ICD-10-CM | POA: Insufficient documentation

## 2017-08-11 DIAGNOSIS — I120 Hypertensive chronic kidney disease with stage 5 chronic kidney disease or end stage renal disease: Secondary | ICD-10-CM | POA: Diagnosis not present

## 2017-08-11 NOTE — Discharge Instructions (Signed)
Keep the dressing on overnight, you can loosen the Coban dressing if it starts to feel too tight

## 2017-08-11 NOTE — ED Provider Notes (Signed)
[facility]-EMERGENCY DEPT Provider Note   CSN: [id number] Arrival date & time: 08/11/17  2131     History   Chief Complaint Chief Complaint  Patient presents with  . Bleeding Fistula    HPI Todd Coleman is a 77 y.o. male.  HPI Patient presented to the emergency room for evaluation of bleeding from his AV fistula.  Patient had dialysis this morning.  He did not have any issues.  When he went to take off his bandage this afternoon it started bleeding.  Usually he just has to put a Band-Aid on it but it would not stop bleeding so he called EMS and was brought to the emergency room.  EMS applied a pressure dressing.  The bleeding stopped.  Patient denies any other complaints.  No chest pain.  No lightheadedness. no other complaints. Past Medical History:  Diagnosis Date  . Diabetes mellitus   . Dialysis patient (HCC)   . Hypertension   . Renal failure     There are no active problems to display for this patient.   Past Surgical History:  Procedure Laterality Date  . BELOW KNEE LEG AMPUTATION    . CARDIAC CATHETERIZATION    . TONSILLECTOMY         Home Medications    Prior to Admission medications   Medication Sig Start Date End Date Taking? Authorizing Provider  Ascorbic Acid (VITAMIN C) 100 MG tablet Take 100 mg by mouth every evening.     Yes [provider]  aspirin 81 MG EC tablet Take 81 mg by mouth every evening.     Yes [provider]  atorvastatin (LIPITOR) 80 MG tablet Take 80 mg by mouth every evening.     Yes [provider]  insulin glargine (LANTUS SOLOSTAR) 100 UNIT/ML injection Inject 20 Units into the skin at bedtime.     Yes [provider]  vitamin E 400 UNIT capsule Take 400 Units by mouth every evening.     Yes [provider]  Zinc 50 MG CAPS Take 1 capsule by mouth every evening.     Yes [provider]  calcitRIOL (ROCALTROL) 0.25 MCG capsule Take 0.25-0.5 mcg by mouth  daily. Take 2 tabs Mon-Sat and take 1 tab on Sun     [provider]  calcium elemental as carbonate (TUMS ULTRA 1000) 400 MG tablet Chew 1,000 mg by mouth 3 (three) times daily.      [provider]  cloNIDine (CATAPRES - DOSED IN MG/24 HR) 0.3 mg/24hr Place 1 patch onto the skin once a week. Change every Mon     [provider]  clopidogrel (PLAVIX) 75 MG tablet Take 75 mg by mouth every evening.      [provider]  doxazosin (CARDURA) 2 MG tablet Take 2 mg by mouth daily.      [provider]  fish oil-omega-3 fatty acids 1000 MG capsule Take 1 g by mouth 2 (two) times daily.      [provider]  glipiZIDE (GLUCOTROL XL) 5 MG 24 hr tablet Take 5 mg by mouth daily.      [provider]  hydrALAZINE (APRESOLINE) 25 MG tablet Take 25 mg by mouth 3 (three) times daily.      [provider]  labetalol (NORMODYNE) 200 MG tablet Take 200 mg by mouth 2 (two) times daily. Take 1 & 1/2 tabs in the morning and 1 tab in the evening  [provider]  Multiple Vitamins-Minerals (MULTIVITAL PO) Take 1 tablet by mouth daily.      [provider]  torsemide (DEMADEX) 20 MG tablet Take 40 mg by mouth daily.    [provider]    Family History No family history on file.  Social History Social History   Tobacco Use  . Smoking status: Former Smoker  Substance Use Topics  . Alcohol use: No  . Drug use: No     Allergies   Dilaudid [hydromorphone hcl]; Penicillins; and Ramipril   Review of Systems Review of Systems  All other systems reviewed and are negative.    Physical Exam Updated Vital Signs BP (!) 175/81   Pulse 82   Temp 97.8 F (36.6 C) (Oral)   Resp 19   Wt 87.1 kg (192 lb)   SpO2 97%   BMI 31.95 kg/m   Physical Exam  Constitutional: He appears well-developed and well-nourished. No distress.  HENT:  Head: Normocephalic and atraumatic.  Right Ear: External ear normal.    Left Ear: External ear normal.  Eyes: Conjunctivae are normal. Right eye exhibits no discharge. Left eye exhibits no discharge. No scleral icterus.  Neck: Neck supple. No tracheal deviation present.  Cardiovascular: Normal rate.  Pulmonary/Chest: Effort normal. No stridor. No respiratory distress.  Abdominal: He exhibits no distension.  Musculoskeletal: He exhibits no edema.  Small pinpoint area of bleeding from the left av fistula , bleeding stops with compression  Neurological: He is alert. Cranial nerve deficit: no gross deficits.  Skin: Skin is warm and dry. No rash noted.  Psychiatric: He has a normal mood and affect.  Nursing note and vitals reviewed.    ED Treatments / Results   Procedures Procedures (including critical care time) Quick clot dressing applied to the small area of bleeding, Coban dressing applied to the wound  Medications Ordered in ED Medications - No data to display   Initial Impression / Assessment and Plan / ED Course  I have reviewed the triage vital signs and the nursing notes.  Pertinent labs & imaging results that were available during my care of the patient were reviewed by me and considered in my medical decision making (see chart for details).   Pt was monitored in the ED.  No further bleeding after the dressing was applied.  Will have the patient keep the dressing on over night.  He understands he can loosen the dressing if he needs to.  Final Clinical Impressions(s) / ED Diagnoses   Final diagnoses:  Bleeding      Linwood DibblesKnapp, Waller Marcussen, MD 08/11/17 2341

## 2017-08-11 NOTE — ED Triage Notes (Signed)
Patient BIB [facility] from [facility]. Patient had dialysis this am, came home at 1200 with no issues. When he removed the bandage on fistula this evening it was still bleeding. Pressure dressing placed by EMS.

## 2017-08-11 NOTE — ED Notes (Addendum)
Quick clot, gauze and coban applied by EDP KNAPPand RN.

## 2017-09-01 HISTORY — PX: CARDIAC CATHETERIZATION: SHX172

## 2017-09-27 ENCOUNTER — Emergency Department (HOSPITAL_COMMUNITY): Payer: Medicare Other

## 2017-09-27 ENCOUNTER — Other Ambulatory Visit: Payer: Self-pay

## 2017-09-27 ENCOUNTER — Inpatient Hospital Stay (HOSPITAL_COMMUNITY)
Admission: EM | Admit: 2017-09-27 | Discharge: 2017-10-04 | DRG: 480 | Disposition: A | Discharge status: Skilled Nursing Facility | Payer: Medicare Other | Attending: Internal Medicine | Admitting: Internal Medicine

## 2017-09-27 ENCOUNTER — Encounter (HOSPITAL_COMMUNITY): Payer: Self-pay | Admitting: Emergency Medicine

## 2017-09-27 DIAGNOSIS — Z89511 Acquired absence of right leg below knee: Secondary | ICD-10-CM

## 2017-09-27 DIAGNOSIS — Z66 Do not resuscitate: Secondary | ICD-10-CM | POA: Diagnosis present

## 2017-09-27 DIAGNOSIS — E1151 Type 2 diabetes mellitus with diabetic peripheral angiopathy without gangrene: Secondary | ICD-10-CM | POA: Diagnosis present

## 2017-09-27 DIAGNOSIS — R269 Unspecified abnormalities of gait and mobility: Secondary | ICD-10-CM | POA: Diagnosis present

## 2017-09-27 DIAGNOSIS — Z993 Dependence on wheelchair: Secondary | ICD-10-CM | POA: Diagnosis not present

## 2017-09-27 DIAGNOSIS — I739 Peripheral vascular disease, unspecified: Secondary | ICD-10-CM | POA: Diagnosis not present

## 2017-09-27 DIAGNOSIS — M62838 Other muscle spasm: Secondary | ICD-10-CM | POA: Diagnosis not present

## 2017-09-27 DIAGNOSIS — Z9861 Coronary angioplasty status: Secondary | ICD-10-CM | POA: Diagnosis not present

## 2017-09-27 DIAGNOSIS — Y9289 Other specified places as the place of occurrence of the external cause: Secondary | ICD-10-CM

## 2017-09-27 DIAGNOSIS — I9581 Postprocedural hypotension: Secondary | ICD-10-CM | POA: Diagnosis not present

## 2017-09-27 DIAGNOSIS — K2270 Barrett's esophagus without dysplasia: Secondary | ICD-10-CM | POA: Diagnosis present

## 2017-09-27 DIAGNOSIS — E1122 Type 2 diabetes mellitus with diabetic chronic kidney disease: Secondary | ICD-10-CM | POA: Diagnosis present

## 2017-09-27 DIAGNOSIS — I120 Hypertensive chronic kidney disease with stage 5 chronic kidney disease or end stage renal disease: Secondary | ICD-10-CM | POA: Diagnosis present

## 2017-09-27 DIAGNOSIS — Z833 Family history of diabetes mellitus: Secondary | ICD-10-CM

## 2017-09-27 DIAGNOSIS — Z419 Encounter for procedure for purposes other than remedying health state, unspecified: Secondary | ICD-10-CM

## 2017-09-27 DIAGNOSIS — Z794 Long term (current) use of insulin: Secondary | ICD-10-CM | POA: Diagnosis not present

## 2017-09-27 DIAGNOSIS — I2582 Chronic total occlusion of coronary artery: Secondary | ICD-10-CM | POA: Diagnosis present

## 2017-09-27 DIAGNOSIS — Z89512 Acquired absence of left leg below knee: Secondary | ICD-10-CM | POA: Diagnosis not present

## 2017-09-27 DIAGNOSIS — Z87891 Personal history of nicotine dependence: Secondary | ICD-10-CM | POA: Diagnosis not present

## 2017-09-27 DIAGNOSIS — Z888 Allergy status to other drugs, medicaments and biological substances status: Secondary | ICD-10-CM

## 2017-09-27 DIAGNOSIS — Z992 Dependence on renal dialysis: Secondary | ICD-10-CM

## 2017-09-27 DIAGNOSIS — Z885 Allergy status to narcotic agent status: Secondary | ICD-10-CM

## 2017-09-27 DIAGNOSIS — I2510 Atherosclerotic heart disease of native coronary artery without angina pectoris: Secondary | ICD-10-CM | POA: Diagnosis present

## 2017-09-27 DIAGNOSIS — M25552 Pain in left hip: Secondary | ICD-10-CM | POA: Diagnosis present

## 2017-09-27 DIAGNOSIS — I10 Essential (primary) hypertension: Secondary | ICD-10-CM | POA: Diagnosis not present

## 2017-09-27 DIAGNOSIS — Z7982 Long term (current) use of aspirin: Secondary | ICD-10-CM

## 2017-09-27 DIAGNOSIS — S72402A Unspecified fracture of lower end of left femur, initial encounter for closed fracture: Principal | ICD-10-CM | POA: Diagnosis present

## 2017-09-27 DIAGNOSIS — S728X2A Other fracture of left femur, initial encounter for closed fracture: Secondary | ICD-10-CM | POA: Diagnosis not present

## 2017-09-27 DIAGNOSIS — N186 End stage renal disease: Secondary | ICD-10-CM | POA: Diagnosis present

## 2017-09-27 DIAGNOSIS — Z7902 Long term (current) use of antithrombotics/antiplatelets: Secondary | ICD-10-CM

## 2017-09-27 DIAGNOSIS — S7290XA Unspecified fracture of unspecified femur, initial encounter for closed fracture: Secondary | ICD-10-CM | POA: Diagnosis present

## 2017-09-27 DIAGNOSIS — D631 Anemia in chronic kidney disease: Secondary | ICD-10-CM | POA: Diagnosis present

## 2017-09-27 DIAGNOSIS — R9431 Abnormal electrocardiogram [ECG] [EKG]: Secondary | ICD-10-CM | POA: Diagnosis not present

## 2017-09-27 DIAGNOSIS — S7292XA Unspecified fracture of left femur, initial encounter for closed fracture: Secondary | ICD-10-CM | POA: Diagnosis present

## 2017-09-27 DIAGNOSIS — W19XXXA Unspecified fall, initial encounter: Secondary | ICD-10-CM | POA: Diagnosis not present

## 2017-09-27 DIAGNOSIS — Z79899 Other long term (current) drug therapy: Secondary | ICD-10-CM

## 2017-09-27 DIAGNOSIS — E785 Hyperlipidemia, unspecified: Secondary | ICD-10-CM | POA: Diagnosis present

## 2017-09-27 DIAGNOSIS — Z01810 Encounter for preprocedural cardiovascular examination: Secondary | ICD-10-CM | POA: Diagnosis not present

## 2017-09-27 DIAGNOSIS — R52 Pain, unspecified: Secondary | ICD-10-CM

## 2017-09-27 DIAGNOSIS — E1129 Type 2 diabetes mellitus with other diabetic kidney complication: Secondary | ICD-10-CM | POA: Diagnosis present

## 2017-09-27 DIAGNOSIS — N2581 Secondary hyperparathyroidism of renal origin: Secondary | ICD-10-CM | POA: Diagnosis present

## 2017-09-27 DIAGNOSIS — Z880 Allergy status to penicillin: Secondary | ICD-10-CM

## 2017-09-27 HISTORY — DX: Atherosclerotic heart disease of native coronary artery without angina pectoris: I25.10

## 2017-09-27 LAB — CBC WITH DIFFERENTIAL/PLATELET
BASOS PCT: 0 %
Basophils Absolute: 0 10*3/uL (ref 0.0–0.1)
Eosinophils Absolute: 0.1 10*3/uL (ref 0.0–0.7)
Eosinophils Relative: 2 %
HEMATOCRIT: 36.4 % — AB (ref 39.0–52.0)
Hemoglobin: 12 g/dL — ABNORMAL LOW (ref 13.0–17.0)
Lymphocytes Relative: 11 %
Lymphs Abs: 0.9 10*3/uL (ref 0.7–4.0)
MCH: 33.3 pg (ref 26.0–34.0)
MCHC: 33 g/dL (ref 30.0–36.0)
MCV: 101.1 fL — ABNORMAL HIGH (ref 78.0–100.0)
MONOS PCT: 5 %
Monocytes Absolute: 0.4 10*3/uL (ref 0.1–1.0)
NEUTROS ABS: 7.4 10*3/uL (ref 1.7–7.7)
Neutrophils Relative %: 82 %
Platelets: 230 10*3/uL (ref 150–400)
RBC: 3.6 MIL/uL — ABNORMAL LOW (ref 4.22–5.81)
RDW: 14.5 % (ref 11.5–15.5)
WBC: 8.9 10*3/uL (ref 4.0–10.5)

## 2017-09-27 LAB — COMPREHENSIVE METABOLIC PANEL
ALBUMIN: 3.5 g/dL (ref 3.5–5.0)
ALK PHOS: 53 U/L (ref 38–126)
ALT: 20 U/L (ref 17–63)
ANION GAP: 17 — AB (ref 5–15)
AST: 45 U/L — ABNORMAL HIGH (ref 15–41)
BUN: 26 mg/dL — ABNORMAL HIGH (ref 6–20)
CALCIUM: 9.2 mg/dL (ref 8.9–10.3)
CO2: 25 mmol/L (ref 22–32)
CREATININE: 6.28 mg/dL — AB (ref 0.61–1.24)
Chloride: 91 mmol/L — ABNORMAL LOW (ref 101–111)
GFR calc Af Amer: 9 mL/min — ABNORMAL LOW (ref >60)
GFR calc non Af Amer: 8 mL/min — ABNORMAL LOW (ref >60)
Glucose, Bld: 243 mg/dL — ABNORMAL HIGH (ref 65–99)
Potassium: 4.4 mmol/L (ref 3.5–5.1)
SODIUM: 133 mmol/L — AB (ref 135–145)
Total Bilirubin: 1 mg/dL (ref 0.3–1.2)
Total Protein: 7.3 g/dL (ref 6.5–8.1)

## 2017-09-27 LAB — TYPE AND SCREEN
ABO/RH(D): A POS
ANTIBODY SCREEN: NEGATIVE

## 2017-09-27 MED ORDER — ONDANSETRON HCL 4 MG/2ML IJ SOLN: 4.0000 mg | Freq: Four times a day (QID) | INTRAMUSCULAR | Status: DC | PRN

## 2017-09-27 MED ORDER — FENTANYL CITRATE (PF) 100 MCG/2ML IJ SOLN
75.0000 ug | Freq: Once | INTRAMUSCULAR | Status: AC
  Administered 2017-09-27: 75 ug via INTRAVENOUS
  Filled 2017-09-27: qty 2

## 2017-09-27 MED ORDER — FENTANYL CITRATE (PF) 100 MCG/2ML IJ SOLN
50.0000 ug | Freq: Once | INTRAMUSCULAR | Status: AC
  Administered 2017-09-27: 50 ug via INTRAVENOUS
  Filled 2017-09-27: qty 2
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
## 2017-09-27 NOTE — ED Notes (Signed)
Patient called out requesting pain medication. EDP aware

## 2017-09-27 NOTE — ED Notes (Signed)
Patient transported to X-ray 

## 2017-09-27 NOTE — ED Notes (Signed)
ED Provider at bedside (Rees) 

## 2017-09-27 NOTE — ED Triage Notes (Signed)
Patient arrived via EMS post fall from wheelchair. NO LOC  dizziness reported. Complaints of tenderness in left facial area after fall. Bleeding present from left nostril. Pain in left knee. Bilateral BKA. Dialysis M,W, F.

## 2017-09-27 NOTE — H&P (Signed)
Todd Coleman ZOX:[id number] DOB: [birth year] DOA: 09/27/2017     PCP: Romie Jumper, PA-C   Outpatient [facility] Patient arrived to ER on 09/27/17 at 1817  Patient coming from:   home Lives With family    Chief Complaint:  Chief Complaint  Patient presents with  . Fall    HPI: Todd Coleman is a 77 y.o. male with medical history significant of CAD, ESRD on HD MWF, HTN, HLD, DM2, Barrett's esophagus      Presented with a fall that occurred today patient fell from his motorized chair landed on his left knee and face denies LOC patient is on dialysis Monday Wednesday Friday last dialysis was today using left shunt.  Recently admitted to [facility] with acute hypoxic respiratory failure due to pulmonary edema.  Patient underwent hemodialysis for volume management. 2D echo (08/31/2017) showed ejection fraction of 50-55%.  Mild concentric hypertrophy given elevated troponin  cardiology was consulted.  Patient underwent cardiac catheterization on 09/18/2017 showed coronary artery disease which was unchanged from before. Medical therapy was advised.  Patient was discharged to home.  Beta-blockers were added to his regimen  Regarding pertinent Chronic problems: History of CAD patient is on Plavix and aspirin History of diabetes on Glucotrol While in ER: Pain management with fentanyl  Following Medications were ordered in ER: Medications  fentaNYL (SUBLIMAZE) injection 50 mcg (50 mcg Intravenous Given 09/27/17 1916)  fentaNYL (SUBLIMAZE) injection 75 mcg (75 mcg Intravenous Given 09/27/17 2043)  fentaNYL (SUBLIMAZE) injection 50 mcg (50 mcg Intravenous Given 09/27/17 2244)    Significant initial  Findings: Abnormal Labs Reviewed  COMPREHENSIVE METABOLIC PANEL - Abnormal; Notable for the following components:      Result Value   Sodium 133 (*)    Chloride 91 (*)    Glucose, Bld 243 (*)    BUN 26 (*)    Creatinine, Ser 6.28 (*)    AST 45 (*)    GFR calc non Af Amer 8 (*)     GFR calc Af Amer 9 (*)    Anion gap 17 (*)    All other components within normal limits  CBC WITH DIFFERENTIAL/PLATELET - Abnormal; Notable for the following components:   RBC 3.60 (*)    Hemoglobin 12.0 (*)    HCT 36.4 (*)    MCV 101.1 (*)    All other components within normal limits     Na 133 K 4.4  Cr   stable,   patient history of end-stage renal disease Lab Results  Component Value Date   CREATININE 6.28 (H) 09/27/2017   CREATININE 3.78 (H) 06/12/2016      WBC 8.9  HG/HCT   stable,       Component Value Date/Time   HGB 12.0 (L) 09/27/2017 2123   HCT 36.4 (L) 09/27/2017 2123        UA  not ordered Left knee showing acute distal femur fracture with no dislocation  CT HEAD and neck  NON acute  CXR - NON acute Hip and pelvis negative for acute fracture    ECG:  Personally reviewed by me showing: HR : 99  Rhythm: sinus tachycardia   no evidence of ischemic changes QTC 500    ED Triage Vitals  Enc Vitals Group     BP 09/27/17 1830 137/66     Pulse Rate 09/27/17 1830 93     Resp 09/27/17 1830 19     Temp 09/27/17 1832 (!) 97.4 F (36.3  C)     Temp Source 09/27/17 1832 Oral     SpO2 09/27/17 1830 100 %     Weight 09/27/17 1835 192 lb (87.1 kg)     Height 09/27/17 1835 5\' 5"  (1.651 m)     Head Circumference --      Peak Flow --      Pain Score 09/27/17 1835 10     Pain Loc --      Pain Edu? --      Excl. in GC? --      Latest  Blood pressure (!) 122/53, pulse 92, temperature 98.9 F (37.2 C), temperature source Oral, resp. rate (!) 25, height 5\' 5"  (1.651 m), weight 87.1 kg (192 lb), SpO2 96 %.   ER Provider Called:     Dr.Murphy They Recommend keeping him NPO Will see in AM   Hospitalist was called for admission for left femoral fracture   Review of Systems:    Pertinent positives include: Facial pain after the fall, left leg pain  Constitutional:  No weight loss, night sweats, Fevers, chills, fatigue, weight loss  HEENT:  No  headaches, Difficulty swallowing,Tooth/dental problems,Sore throat,  No sneezing, itching, ear ache, nasal congestion, post nasal drip,  Cardio-vascular:  No chest pain, Orthopnea, PND, anasarca, dizziness, palpitations.no Bilateral lower extremity swelling  GI:  No heartburn, indigestion, abdominal pain, nausea, vomiting, diarrhea, change in bowel habits, loss of appetite, melena, blood in stool, hematemesis Resp:  no shortness of breath at rest. No dyspnea on exertion, No excess mucus, no productive cough, No non-productive cough, No coughing up of blood.No change in color of mucus.No wheezing. Skin:  no rash or lesions. No jaundice GU:  no dysuria, change in color of urine, no urgency or frequency. No straining to urinate.  No flank pain.  Musculoskeletal:  No joint pain or no joint swelling. No decreased range of motion. No back pain.  Psych:  No change in mood or affect. No depression or anxiety. No memory loss.  Neuro: no localizing neurological complaints, no tingling, no weakness, no double vision, no gait abnormality, no slurred speech, no confusion  As per HPI otherwise 10 point review of systems negative.   Past Medical History:   Past Medical History:  Diagnosis Date  . Diabetes mellitus   . Dialysis patient (HCC)   . Hypertension   . Renal failure       Past Surgical History:  Procedure Laterality Date  . BELOW KNEE LEG AMPUTATION    . CARDIAC CATHETERIZATION    . TONSILLECTOMY      Social History:  Ambulatory    wheelchair bound      reports that he has quit smoking. He has never used smokeless tobacco. He reports that he does not drink alcohol or use drugs.     Family History:   Family History  Problem Relation Age of Onset  . Diabetes Other     Allergies: Allergies  Allergen Reactions  . Dilaudid [Hydromorphone Hcl] Other (See Comments)    "makes me crazy".  . Penicillins Rash  . Ramipril Rash     Prior to Admission medications     Medication Sig Start Date End Date Taking? Authorizing Provider  Ascorbic Acid (VITAMIN C) 100 MG tablet Take 100 mg by mouth every evening.      [provider]  aspirin 81 MG EC tablet Take 81 mg by mouth every evening.      [provider]  atorvastatin (LIPITOR) 80  MG tablet Take 80 mg by mouth every evening.      [provider]  calcitRIOL (ROCALTROL) 0.25 MCG capsule Take 0.25-0.5 mcg by mouth daily. Take 2 tabs Mon-Sat and take 1 tab on Sun     [provider]  calcium elemental as carbonate (TUMS ULTRA 1000) 400 MG tablet Chew 1,000 mg by mouth 3 (three) times daily.      [provider]  cloNIDine (CATAPRES - DOSED IN MG/24 HR) 0.3 mg/24hr Place 1 patch onto the skin once a week. Change every Mon     [provider]  clopidogrel (PLAVIX) 75 MG tablet Take 75 mg by mouth every evening.      [provider]  doxazosin (CARDURA) 2 MG tablet Take 2 mg by mouth daily.      [provider]  fish oil-omega-3 fatty acids 1000 MG capsule Take 1 g by mouth 2 (two) times daily.      [provider]  glipiZIDE (GLUCOTROL XL) 5 MG 24 hr tablet Take 5 mg by mouth daily.      [provider]  hydrALAZINE (APRESOLINE) 25 MG tablet Take 25 mg by mouth 3 (three) times daily.      [provider]  insulin glargine (LANTUS SOLOSTAR) 100 UNIT/ML injection Inject 20 Units into the skin at bedtime.      [provider]  labetalol (NORMODYNE) 200 MG tablet Take 200 mg by mouth 2 (two) times daily.     [provider]  Multiple Vitamins-Minerals (MULTIVITAL PO) Take 1 tablet by mouth daily.      [provider]  torsemide (DEMADEX) 20 MG tablet Take 40 mg by mouth daily.    [provider]  vitamin E 400 UNIT capsule Take 400 Units by mouth every evening.      [provider]  Zinc 50 MG CAPS Take 1 capsule by mouth every evening.      [provider]    Physical Exam: Blood pressure (!) 122/53, pulse 92, temperature 98.9 F (37.2 C), temperature source Oral, resp. rate (!) 25, height 5\' 5"  (1.651 m), weight 87.1 kg (192 lb), SpO2 96 %. 1. General:  in No Acute distress   Chronically ill  -appearing 2. Psychological: Alert and   Oriented 3. Head/ENT:     Dry Mucous Membranes                          Abrasions to the face neck supple                          Poor Dentition 4. SKIN: normal  Skin turgor,  Skin clean Dry and intact abrasion to the knee  5. Heart: Regular rate and rhythm no Murmur, no Rub or gallop 6. Lungs Clear to auscultation bilaterally, no wheezes or crackles   7. Abdomen: Soft,  non-tender, Non distended   obese  bowel sounds present 8. Lower extremities: no clubbing, cyanosis bilateral BKA 9. Neurologically Grossly intact, moving all 4 extremities equally   10. MSK: Normal range of motion limited  lower extremities especially left shortened left lower extremity  LABS:     Recent Labs  Lab 09/27/17 2123  WBC 8.9  NEUTROABS 7.4  HGB 12.0*  HCT 36.4*  MCV 101.1*  PLT 230   Basic Metabolic Panel: Recent Labs  Lab 09/27/17 2123  NA 133*  K 4.4  CL 91*  CO2 25  GLUCOSE 243*  BUN 26*  CREATININE 6.28*  CALCIUM 9.2      Recent Labs  Lab 09/27/17 2123  AST 45*  ALT 20  ALKPHOS 53  BILITOT 1.0  PROT 7.3  ALBUMIN 3.5   No results for input(s): LIPASE, AMYLASE in the last 168 hours. No results for input(s): AMMONIA in the last 168 hours.    HbA1C: No results for input(s): HGBA1C in the last 72 hours. CBG: No results for input(s): GLUCAP in the last 168 hours.    Urine analysis: No results found for: COLORURINE, APPEARANCEUR, LABSPEC, PHURINE, GLUCOSEU, HGBUR, BILIRUBINUR, KETONESUR, PROTEINUR, UROBILINOGEN, NITRITE, LEUKOCYTESUR     Cultures: No results found for: SDES, SPECREQUEST, CULT, REPTSTATUS   Radiological Exams on Admission: Dg Knee 1-2 Views Left  Result Date:  09/27/2017 CLINICAL DATA:  LEFT hip and knee pain after fall from wheelchair. History of LEFT hip surgery. EXAM: LEFT KNEE - 1-2 VIEW COMPARISON:  None available for comparison at time of study interpretation. FINDINGS: Limited assessment due to nonstandard views, per technologist patient was in pain and had difficulty maintaining position for imaging. Status post LEFT below-knee amputation. Acute comminuted impacted femoral metadiaphysis fracture. No definite intra-articular extension. Osteopenia. No destructive bony lesions. Severe vascular calcification and stent and popliteal fossa. IMPRESSION: Acute distal femur fracture.  No dislocation. Status post below-knee amputation. Electronically Signed   By: Awilda Metro M.D.   On: 09/27/2017 20:48   Ct Head Wo Contrast  Result Date: 09/27/2017 CLINICAL DATA:  Fall from wheelchair. EXAM: CT HEAD WITHOUT CONTRAST CT CERVICAL SPINE WITHOUT CONTRAST TECHNIQUE: Multidetector CT imaging of the head and cervical spine was performed following the standard protocol without intravenous contrast. Multiplanar CT image reconstructions of the cervical spine were also generated. COMPARISON:  08/03/2016 FINDINGS: CT HEAD FINDINGS Brain: There is diffuse low attenuation throughout the subcortical and periventricular white matter compatible with chronic small vessel ischemic disease. Prominence of the sulci and ventricles are noted compatible with brain atrophy. No evidence for acute infarction, hemorrhage, hydrocephalus, extra-axial collection or mass lesion/mass effect. Vascular: No hyperdense vessel or unexpected calcification. Skull: Normal. Negative for fracture or focal lesion. Sinuses/Orbits: There is mild mucosal thickening involving the right maxillary sinus. Partial opacification of the left mastoid air cells. Other: None CT CERVICAL SPINE FINDINGS Alignment: Normal. Skull base and vertebrae: No acute fracture. No primary bone lesion or focal pathologic process. Soft  tissues and spinal canal: No prevertebral fluid or swelling. No visible canal hematoma. Disc levels: Disc space narrowing and ventral spurring noted at C5-6 and C6-7. Upper chest: Negative. Other: None IMPRESSION: 1. No acute intracranial abnormalities. Chronic small vessel ischemic disease and brain atrophy noted. 2. No evidence for cervical spine fracture or dislocation. 3. 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 Electronically Signed   By: Signa Kell M.D.   On: 09/27/2017 20:50   Ct Cervical Spine Wo Contrast  Result Date: 09/27/2017 CLINICAL DATA:  Fall from wheelchair. EXAM: CT HEAD WITHOUT CONTRAST CT CERVICAL SPINE WITHOUT CONTRAST TECHNIQUE: Multidetector CT imaging of the head and cervical spine was performed following the standard protocol without intravenous contrast. Multiplanar CT image reconstructions of the cervical spine were also generated. COMPARISON:  08/03/2016 FINDINGS: CT HEAD FINDINGS Brain: There is diffuse low attenuation throughout the subcortical and periventricular white matter compatible with chronic small vessel ischemic disease. Prominence of the sulci and ventricles are noted compatible with brain atrophy. No evidence for acute infarction, hemorrhage, hydrocephalus,  extra-axial collection or mass lesion/mass effect. Vascular: No hyperdense vessel or unexpected calcification. Skull: Normal. Negative for fracture or focal lesion. Sinuses/Orbits: There is mild mucosal thickening involving the right maxillary sinus. Partial opacification of the left mastoid air cells. Other: None CT CERVICAL SPINE FINDINGS Alignment: Normal. Skull base and vertebrae: No acute fracture. No primary bone lesion or focal pathologic process. Soft tissues and spinal canal: No prevertebral fluid or swelling. No visible canal hematoma. Disc levels: Disc space narrowing and ventral spurring noted at C5-6 and C6-7. Upper chest: Negative. Other: None IMPRESSION: 1. No acute intracranial  abnormalities. Chronic small vessel ischemic disease and brain atrophy noted. 2. No evidence for cervical spine fracture or dislocation. 3. 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 Electronically Signed   By: Signa Kell M.D.   On: 09/27/2017 20:50   Dg Chest Port 1 View  Result Date: 09/27/2017 CLINICAL DATA:  Fall out of wheelchair today. EXAM: PORTABLE CHEST 1 VIEW COMPARISON:  Radiograph 08/30/2017 FINDINGS: Low lung volumes. Similar in size and mediastinal contours to prior exam allowing for differences in technique. Improved pulmonary edema and vascular congestion. Streaky left lung base atelectasis or scarring. No focal airspace disease. No pneumothorax or large pleural effusion. Vascular stent in the left axilla again seen. No evidence of acute osseous abnormality. IMPRESSION: Low lung volumes without acute abnormality or evidence of acute traumatic injury. Electronically Signed   By: Rubye Oaks M.D.   On: 09/27/2017 21:38   Dg Hip Unilat W Or Wo Pelvis 2-3 Views Left  Result Date: 09/27/2017 CLINICAL DATA:  Left hip and knee pain after fall from wheelchair today. EXAM: DG HIP (WITH OR WITHOUT PELVIS) 2-3V LEFT COMPARISON:  06/13/2016 intraoperative radiograph of the left hip. FINDINGS: Four cannulated screws are noted along the long axis of the left femoral neck traversing a chronic subcapital impacted left femoral neck fracture. No change in configuration. No evidence of acute fracture, hardware failure or joint dislocation. Intact bony pelvis. Lower lumbar facet arthropathy at L5-S1. Aorto-bi-iliofemoral atherosclerosis with vascular surgical clips projecting over the groin bilaterally. IMPRESSION: Negative for acute fracture or malalignment. Intact left femoral neck fixation. Electronically Signed   By: Tollie Eth M.D.   On: 09/27/2017 20:19    Chart has been reviewed    Assessment/Plan  77 y.o. male with medical history significant of CAD, ESRD on HD MWF, HTN,  HLD, DM2, Barrett's esophagus  Admitted for left femoral fracture  Present on Admission: . Femoral fracture [facility]) appreciate orthopedics input regarding management.  Given extensive medical problems patient at least moderate to high risk have had recent cardiac catheterization.  And cardiac echogram currently chest pain-free no indication for further cardiac workup. For now we will hold Plavix. Marland Kitchen ESRD (end stage renal disease) (HCC) -last hemodialysis was on Monday will need another session on Wednesday please consult nephrology tomorrow . HTN (hypertension) stable continue beta-blockers perioperatively if needs to be in able to tolerate . DM (diabetes mellitus), type 2 with renal complications (HCC) - Order Sensitive  SSI   - continue home insulin regimen deccrease   Lantus 10 units,  -  check TSH and HgA1C  - Hold by mouth medications    . CAD (coronary artery disease) recent cardiac catheterization reassuring hold aspirin and Plavix for today patient may need operative intervention in the near future . PAD (peripheral artery disease) (HCC) stable   . Prolonged QT interval - - will monitor on tele avoid QT prolonging medications, rehydrate correct  electrolytes    Other plan as per orders.  DVT prophylaxis:  SCD   Code Status:  DNR/DNI  as per patient  I had personally discussed CODE STATUS with patient  Family Communication:   Family not  at  Bedside  Disposition Plan:     likely will need placement for rehabilitation                                       Would benefit from PT/OT eval prior to DC please defer to orthopedics for orders                         Social Work    consulted                          Consults called: Orthopedics is aware, email cardiology to help manage peri operatively given extensive cardiac disease  Admission status:   inpatient       Level of care   tele         Therisa Doyne 09/28/2017, 12:10 AM    [facility]  Pager [phone]    after 2 AM please page floor coverage PA If 7AM-7PM, please contact the day team taking care of the patient  [email or web address]  Password [email or web address]

## 2017-09-27 NOTE — ED Triage Notes (Signed)
Pt arrives via [facility]; fell from motorized wheelchair; hit L knee and head; n/v; no LOC; not on blood thinners; [facility] vitals: 119/62; 97% on RA; 249 CBG; 18 RR  Pt is a dialysis pt; up to date on dialysis; L sided shunt

## 2017-09-27 NOTE — ED Notes (Addendum)
Pt asked for pain medicine, informed Joyce-RN and Dr. Madilyn Hookees.

## 2017-09-27 NOTE — ED Notes (Signed)
Patient back in room

## 2017-09-28 ENCOUNTER — Inpatient Hospital Stay (HOSPITAL_COMMUNITY): Payer: Medicare Other | Admitting: Anesthesiology

## 2017-09-28 ENCOUNTER — Encounter (HOSPITAL_COMMUNITY): Payer: Self-pay | Admitting: Internal Medicine

## 2017-09-28 ENCOUNTER — Inpatient Hospital Stay (HOSPITAL_COMMUNITY): Payer: Medicare Other

## 2017-09-28 ENCOUNTER — Encounter (HOSPITAL_COMMUNITY)
Admission: EM | Disposition: A | Discharge status: Skilled Nursing Facility | Payer: Self-pay | Source: Home / Self Care | Attending: Internal Medicine

## 2017-09-28 ENCOUNTER — Other Ambulatory Visit: Payer: Self-pay

## 2017-09-28 DIAGNOSIS — Z01810 Encounter for preprocedural cardiovascular examination: Secondary | ICD-10-CM

## 2017-09-28 DIAGNOSIS — I2510 Atherosclerotic heart disease of native coronary artery without angina pectoris: Secondary | ICD-10-CM

## 2017-09-28 DIAGNOSIS — Z794 Long term (current) use of insulin: Secondary | ICD-10-CM

## 2017-09-28 DIAGNOSIS — Z419 Encounter for procedure for purposes other than remedying health state, unspecified: Secondary | ICD-10-CM

## 2017-09-28 DIAGNOSIS — R9431 Abnormal electrocardiogram [ECG] [EKG]: Secondary | ICD-10-CM | POA: Diagnosis present

## 2017-09-28 DIAGNOSIS — N186 End stage renal disease: Secondary | ICD-10-CM

## 2017-09-28 DIAGNOSIS — E1122 Type 2 diabetes mellitus with diabetic chronic kidney disease: Secondary | ICD-10-CM

## 2017-09-28 DIAGNOSIS — Z992 Dependence on renal dialysis: Secondary | ICD-10-CM

## 2017-09-28 HISTORY — PX: ORIF FEMUR FRACTURE: SHX2119

## 2017-09-28 LAB — GLUCOSE, CAPILLARY
GLUCOSE-CAPILLARY: 246 mg/dL — AB (ref 65–99)
GLUCOSE-CAPILLARY: 200 mg/dL — AB (ref 65–99)
Glucose-Capillary: 248 mg/dL — ABNORMAL HIGH (ref 65–99)
Glucose-Capillary: 168 mg/dL — ABNORMAL HIGH (ref 65–99)
Glucose-Capillary: 189 mg/dL — ABNORMAL HIGH (ref 65–99)
Glucose-Capillary: 158 mg/dL — ABNORMAL HIGH (ref 65–99)

## 2017-09-28 LAB — SURGICAL PCR SCREEN
MRSA, PCR: POSITIVE — AB
Staphylococcus aureus: POSITIVE — AB

## 2017-09-28 LAB — HEMOGLOBIN A1C
HEMOGLOBIN A1C: 6.2 % — AB (ref 4.8–5.6)
MEAN PLASMA GLUCOSE: 131.24 mg/dL

## 2017-09-28 LAB — ABO/RH: ABO/RH(D): A POS

## 2017-09-28 SURGERY — OPEN REDUCTION INTERNAL FIXATION (ORIF) DISTAL FEMUR FRACTURE
Anesthesia: General | Laterality: Left

## 2017-09-28 MED ORDER — CLOPIDOGREL BISULFATE 75 MG PO TABS
75.0000 mg | ORAL_TABLET | Freq: Every evening | ORAL | Status: DC
  Administered 2017-09-29 – 2017-10-04 (×6): 75 mg via ORAL
  Filled 2017-09-28 (×7): qty 1

## 2017-09-28 MED ORDER — PROPOFOL 10 MG/ML IV BOLUS
INTRAVENOUS | Status: DC | PRN
  Administered 2017-09-28: 130 mg via INTRAVENOUS

## 2017-09-28 MED ORDER — PHENYLEPHRINE HCL 10 MG/ML IJ SOLN
INTRAMUSCULAR | Status: DC | PRN
  Administered 2017-09-28: 80 ug via INTRAVENOUS
  Administered 2017-09-28: 100 ug via INTRAVENOUS
  Administered 2017-09-28: 80 ug via INTRAVENOUS
  Administered 2017-09-28: 100 ug via INTRAVENOUS
  Administered 2017-09-28 (×2): 80 ug via INTRAVENOUS
  Administered 2017-09-28 (×2): 100 ug via INTRAVENOUS
  Administered 2017-09-28: 80 ug via INTRAVENOUS
  Administered 2017-09-28: 100 ug via INTRAVENOUS

## 2017-09-28 MED ORDER — MIDAZOLAM HCL 2 MG/2ML IJ SOLN
INTRAMUSCULAR | Status: AC
  Filled 2017-09-28: qty 2

## 2017-09-28 MED ORDER — ONDANSETRON HCL 4 MG/2ML IJ SOLN
INTRAMUSCULAR | Status: DC | PRN
  Administered 2017-09-28: 4 mg via INTRAVENOUS

## 2017-09-28 MED ORDER — OXYCODONE HCL 5 MG PO TABS
5.0000 mg | ORAL_TABLET | ORAL | Status: DC | PRN
  Administered 2017-09-28 – 2017-10-04 (×21): 10 mg via ORAL
  Filled 2017-09-28 (×21): qty 2

## 2017-09-28 MED ORDER — LIDOCAINE HCL 2 % EX GEL
CUTANEOUS | Status: AC
  Filled 2017-09-28: qty 20

## 2017-09-28 MED ORDER — BUPIVACAINE HCL (PF) 0.25 % IJ SOLN
INTRAMUSCULAR | Status: DC | PRN
  Administered 2017-09-28: 30 mL

## 2017-09-28 MED ORDER — ACETAMINOPHEN 500 MG PO TABS
1000.0000 mg | ORAL_TABLET | Freq: Three times a day (TID) | ORAL | Status: AC
  Administered 2017-09-28 – 2017-09-29 (×4): 1000 mg via ORAL
  Filled 2017-09-28 (×4): qty 2

## 2017-09-28 MED ORDER — DOCUSATE SODIUM 100 MG PO CAPS: 100.0000 mg | ORAL_CAPSULE | Freq: Two times a day (BID) | ORAL | 0 refills | Status: AC

## 2017-09-28 MED ORDER — DOCUSATE SODIUM 100 MG PO CAPS
100.0000 mg | ORAL_CAPSULE | Freq: Two times a day (BID) | ORAL | Status: DC
  Administered 2017-09-28 – 2017-10-03 (×10): 100 mg via ORAL
  Filled 2017-09-28 (×12): qty 1

## 2017-09-28 MED ORDER — FENTANYL CITRATE (PF) 100 MCG/2ML IJ SOLN
INTRAMUSCULAR | Status: DC | PRN
  Administered 2017-09-28: 25 ug via INTRAVENOUS

## 2017-09-28 MED ORDER — ATORVASTATIN CALCIUM 80 MG PO TABS
80.0000 mg | ORAL_TABLET | Freq: Every evening | ORAL | Status: DC
  Administered 2017-09-28 – 2017-10-03 (×6): 80 mg via ORAL
  Filled 2017-09-28 (×6): qty 1

## 2017-09-28 MED ORDER — ACETAMINOPHEN 325 MG PO TABS
325.0000 mg | ORAL_TABLET | Freq: Four times a day (QID) | ORAL | Status: DC | PRN
  Administered 2017-10-02 – 2017-10-03 (×3): 650 mg via ORAL
  Filled 2017-09-28 (×3): qty 2

## 2017-09-28 MED ORDER — OXYCODONE HCL 5 MG PO TABS: 5.0000 mg | ORAL_TABLET | ORAL | 0 refills | Status: AC | PRN

## 2017-09-28 MED ORDER — GABAPENTIN 300 MG PO CAPS: 300.0000 mg | ORAL_CAPSULE | Freq: Once | ORAL | Status: DC

## 2017-09-28 MED ORDER — PHENYLEPHRINE 40 MCG/ML (10ML) SYRINGE FOR IV PUSH (FOR BLOOD PRESSURE SUPPORT)
PREFILLED_SYRINGE | INTRAVENOUS | Status: AC
Start: 2017-09-28 — End: 2017-09-28
  Filled 2017-09-28: qty 10

## 2017-09-28 MED ORDER — LACTATED RINGERS IV SOLN
INTRAVENOUS | Status: DC
  Administered 2017-09-28: 10:00:00 via INTRAVENOUS

## 2017-09-28 MED ORDER — ACETAMINOPHEN 500 MG PO TABS: 1000.0000 mg | ORAL_TABLET | Freq: Three times a day (TID) | ORAL | 0 refills | Status: AC

## 2017-09-28 MED ORDER — PHENYLEPHRINE 40 MCG/ML (10ML) SYRINGE FOR IV PUSH (FOR BLOOD PRESSURE SUPPORT)
PREFILLED_SYRINGE | INTRAVENOUS | Status: AC
  Filled 2017-09-28: qty 10

## 2017-09-28 MED ORDER — CALCITRIOL 0.25 MCG PO CAPS: 0.2500 ug | ORAL_CAPSULE | ORAL | Status: DC

## 2017-09-28 MED ORDER — SODIUM CHLORIDE 0.9 % IV SOLN
INTRAVENOUS | Status: DC | PRN
  Administered 2017-09-28: 13:00:00 via INTRAVENOUS

## 2017-09-28 MED ORDER — INSULIN ASPART 100 UNIT/ML ~~LOC~~ SOLN
0.0000 [IU] | SUBCUTANEOUS | Status: DC
  Administered 2017-09-28: 2 [IU] via SUBCUTANEOUS
  Administered 2017-09-28 (×2): 3 [IU] via SUBCUTANEOUS
  Administered 2017-09-28 – 2017-09-29 (×4): 2 [IU] via SUBCUTANEOUS
  Administered 2017-09-29 (×2): 1 [IU] via SUBCUTANEOUS
  Administered 2017-09-30 (×4): 2 [IU] via SUBCUTANEOUS

## 2017-09-28 MED ORDER — MIDAZOLAM HCL 2 MG/2ML IJ SOLN
INTRAMUSCULAR | Status: AC
  Administered 2017-09-28: 1 mg
  Filled 2017-09-28: qty 2

## 2017-09-28 MED ORDER — ASPIRIN 325 MG PO TABS
325.0000 mg | ORAL_TABLET | Freq: Every day | ORAL | Status: DC
  Administered 2017-09-28 – 2017-10-04 (×7): 325 mg via ORAL
  Filled 2017-09-28 (×8): qty 1

## 2017-09-28 MED ORDER — BUPIVACAINE-EPINEPHRINE (PF) 0.5% -1:200000 IJ SOLN
INTRAMUSCULAR | Status: DC | PRN
  Administered 2017-09-28: 30 mL via PERINEURAL

## 2017-09-28 MED ORDER — LIDOCAINE HCL (CARDIAC) 20 MG/ML IV SOLN
INTRAVENOUS | Status: DC | PRN
  Administered 2017-09-28: 60 mg via INTRAVENOUS

## 2017-09-28 MED ORDER — OXYCODONE HCL 5 MG PO TABS: 5.0000 mg | ORAL_TABLET | Freq: Once | ORAL | Status: DC | PRN

## 2017-09-28 MED ORDER — MIDAZOLAM HCL 2 MG/2ML IJ SOLN
INTRAMUSCULAR | Status: DC | PRN
  Administered 2017-09-28: 1 mg via INTRAVENOUS

## 2017-09-28 MED ORDER — CEFAZOLIN SODIUM-DEXTROSE 2-4 GM/100ML-% IV SOLN
2.0000 g | INTRAVENOUS | Status: DC
  Filled 2017-09-28: qty 100

## 2017-09-28 MED ORDER — INSULIN GLARGINE 100 UNIT/ML ~~LOC~~ SOLN
10.0000 [IU] | Freq: Every day | SUBCUTANEOUS | Status: DC
  Administered 2017-09-28 – 2017-09-29 (×3): 10 [IU] via SUBCUTANEOUS
  Filled 2017-09-28 (×3): qty 0.1

## 2017-09-28 MED ORDER — ASPIRIN EC 325 MG PO TBEC: 325.0000 mg | DELAYED_RELEASE_TABLET | Freq: Every day | ORAL | 0 refills | Status: AC

## 2017-09-28 MED ORDER — FENTANYL CITRATE (PF) 100 MCG/2ML IJ SOLN: 25.0000 ug | INTRAMUSCULAR | Status: DC | PRN

## 2017-09-28 MED ORDER — CHLORHEXIDINE GLUCONATE 4 % EX LIQD: 60.0000 mL | Freq: Once | CUTANEOUS | Status: DC

## 2017-09-28 MED ORDER — FENTANYL CITRATE (PF) 250 MCG/5ML IJ SOLN
INTRAMUSCULAR | Status: AC
  Filled 2017-09-28: qty 5

## 2017-09-28 MED ORDER — PROPOFOL 10 MG/ML IV BOLUS
INTRAVENOUS | Status: AC
  Filled 2017-09-28: qty 20

## 2017-09-28 MED ORDER — LIDOCAINE 2% (20 MG/ML) 5 ML SYRINGE
INTRAMUSCULAR | Status: AC
  Filled 2017-09-28: qty 5

## 2017-09-28 MED ORDER — METHOCARBAMOL 1000 MG/10ML IJ SOLN: 500.0000 mg | Freq: Four times a day (QID) | INTRAVENOUS | Status: DC | PRN

## 2017-09-28 MED ORDER — DOXAZOSIN MESYLATE 2 MG PO TABS
2.0000 mg | ORAL_TABLET | Freq: Every day | ORAL | Status: DC
  Administered 2017-09-28: 2 mg via ORAL
  Filled 2017-09-28 (×2): qty 1

## 2017-09-28 MED ORDER — MORPHINE SULFATE (PF) 2 MG/ML IV SOLN: 1.0000 mg | INTRAVENOUS | Status: DC | PRN

## 2017-09-28 MED ORDER — METOCLOPRAMIDE HCL 5 MG/ML IJ SOLN
5.0000 mg | Freq: Four times a day (QID) | INTRAMUSCULAR | Status: DC | PRN
  Administered 2017-09-28 – 2017-09-30 (×3): 5 mg via INTRAVENOUS
  Filled 2017-09-28 (×3): qty 2

## 2017-09-28 MED ORDER — CALCITRIOL 0.5 MCG PO CAPS
0.5000 ug | ORAL_CAPSULE | ORAL | Status: DC
  Administered 2017-09-28: 0.5 ug via ORAL
  Filled 2017-09-28: qty 1
  Filled 2017-09-28: qty 2

## 2017-09-28 MED ORDER — LABETALOL HCL 200 MG PO TABS: 200.0000 mg | ORAL_TABLET | Freq: Two times a day (BID) | ORAL | Status: DC

## 2017-09-28 MED ORDER — CEFAZOLIN SODIUM-DEXTROSE 2-4 GM/100ML-% IV SOLN
2.0000 g | INTRAVENOUS | Status: AC
  Administered 2017-09-28: 2 g via INTRAVENOUS
  Filled 2017-09-28: qty 100

## 2017-09-28 MED ORDER — BUPIVACAINE HCL (PF) 0.25 % IJ SOLN
INTRAMUSCULAR | Status: AC
  Filled 2017-09-28: qty 30

## 2017-09-28 MED ORDER — METHOCARBAMOL 500 MG PO TABS
500.0000 mg | ORAL_TABLET | Freq: Four times a day (QID) | ORAL | Status: DC | PRN
  Administered 2017-09-28 – 2017-10-02 (×9): 500 mg via ORAL
  Filled 2017-09-28 (×9): qty 1

## 2017-09-28 MED ORDER — MORPHINE SULFATE (PF) 4 MG/ML IV SOLN
0.5000 mg | INTRAVENOUS | Status: DC | PRN
  Filled 2017-09-28: qty 1

## 2017-09-28 MED ORDER — ONDANSETRON HCL 4 MG/2ML IJ SOLN
INTRAMUSCULAR | Status: AC
Start: 2017-09-28 — End: 2017-09-28
  Filled 2017-09-28: qty 2

## 2017-09-28 MED ORDER — ACETAMINOPHEN 500 MG PO TABS
1000.0000 mg | ORAL_TABLET | Freq: Once | ORAL | Status: AC
  Administered 2017-09-28: 1000 mg via ORAL
  Filled 2017-09-28: qty 2

## 2017-09-28 MED ORDER — FENTANYL CITRATE (PF) 100 MCG/2ML IJ SOLN: 50.0000 ug | Freq: Once | INTRAMUSCULAR | Status: DC

## 2017-09-28 MED ORDER — FENTANYL CITRATE (PF) 100 MCG/2ML IJ SOLN
INTRAMUSCULAR | Status: AC
  Administered 2017-09-28: 50 ug
  Filled 2017-09-28: qty 2

## 2017-09-28 MED ORDER — OXYCODONE HCL 5 MG/5ML PO SOLN: 5.0000 mg | Freq: Once | ORAL | Status: DC | PRN

## 2017-09-28 MED ORDER — 0.9 % SODIUM CHLORIDE (POUR BTL) OPTIME
TOPICAL | Status: DC | PRN
  Administered 2017-09-28: 1000 mL

## 2017-09-28 MED ORDER — CEFAZOLIN SODIUM-DEXTROSE 2-4 GM/100ML-% IV SOLN
2.0000 g | Freq: Four times a day (QID) | INTRAVENOUS | Status: AC
  Administered 2017-09-29: 2 g via INTRAVENOUS
  Filled 2017-09-28 (×2): qty 100

## 2017-09-28 MED ORDER — FENTANYL CITRATE (PF) 100 MCG/2ML IJ SOLN
50.0000 ug | INTRAMUSCULAR | Status: DC | PRN
Start: 2017-09-28 — End: 2017-10-02
  Administered 2017-09-28 – 2017-09-30 (×7): 50 ug via INTRAVENOUS
  Filled 2017-09-28 (×7): qty 2

## 2017-09-28 SURGICAL SUPPLY — 54 items
BANDAGE ACE 4X5 VEL STRL LF (GAUZE/BANDAGES/DRESSINGS) ×3 IMPLANT
BANDAGE ACE 6X5 VEL STRL LF (GAUZE/BANDAGES/DRESSINGS) ×3 IMPLANT
BIT DRILL CALIBRATED 4.3MMX365 (DRILL) IMPLANT
BIT DRILL CROWE PNT TWST 4.5MM (DRILL) IMPLANT
BLADE CLIPPER SURG (BLADE) IMPLANT
BNDG GAUZE ELAST 4 BULKY (GAUZE/BANDAGES/DRESSINGS) ×3 IMPLANT
DRAPE C-ARM 42X72 X-RAY (DRAPES) ×3 IMPLANT
DRAPE C-ARMOR (DRAPES) ×3 IMPLANT
DRAPE IMP U-DRAPE 54X76 (DRAPES) ×3 IMPLANT
DRAPE ORTHO SPLIT 77X108 STRL (DRAPES) ×6
DRAPE SURG ORHT 6 SPLT 77X108 (DRAPES) ×2 IMPLANT
DRAPE U-SHAPE 47X51 STRL (DRAPES) ×3 IMPLANT
DRILL CALIBRATED 4.3MMX365 (DRILL) ×3
DRILL CROWE POINT TWIST 4.5MM (DRILL) ×3
DRSG ADAPTIC 3X8 NADH LF (GAUZE/BANDAGES/DRESSINGS) ×3 IMPLANT
DRSG MEPILEX BORDER 4X4 (GAUZE/BANDAGES/DRESSINGS) ×2 IMPLANT
DRSG PAD ABDOMINAL 8X10 ST (GAUZE/BANDAGES/DRESSINGS) ×8 IMPLANT
ELECT REM PT RETURN 9FT ADLT (ELECTROSURGICAL) ×3
ELECTRODE REM PT RTRN 9FT ADLT (ELECTROSURGICAL) ×1 IMPLANT
GAUZE SPONGE 4X4 12PLY STRL (GAUZE/BANDAGES/DRESSINGS) ×3 IMPLANT
GLOVE BIO SURGEON STRL SZ7.5 (GLOVE) ×6 IMPLANT
GLOVE BIOGEL PI IND STRL 8 (GLOVE) ×2 IMPLANT
GLOVE BIOGEL PI INDICATOR 8 (GLOVE) ×4
GOWN STRL REUS W/ TWL LRG LVL3 (GOWN DISPOSABLE) ×2 IMPLANT
GOWN STRL REUS W/ TWL XL LVL3 (GOWN DISPOSABLE) ×1 IMPLANT
GOWN STRL REUS W/TWL LRG LVL3 (GOWN DISPOSABLE) ×6
GOWN STRL REUS W/TWL XL LVL3 (GOWN DISPOSABLE) ×3
GUIDEPIN 3.2X17.5 THRD DISP (PIN) ×2 IMPLANT
GUIDEWIRE BEAD TIP (WIRE) ×2 IMPLANT
KIT BASIN OR (CUSTOM PROCEDURE TRAY) ×3 IMPLANT
KIT TURNOVER KIT B (KITS) ×3 IMPLANT
MANIFOLD NEPTUNE II (INSTRUMENTS) ×3 IMPLANT
NAIL FEM RETRO 10.5X320 (Nail) ×2 IMPLANT
NS IRRIG 1000ML POUR BTL (IV SOLUTION) ×3 IMPLANT
PACK TOTAL JOINT (CUSTOM PROCEDURE TRAY) ×3 IMPLANT
PACK UNIVERSAL I (CUSTOM PROCEDURE TRAY) ×3 IMPLANT
PAD ARMBOARD 7.5X6 YLW CONV (MISCELLANEOUS) ×6 IMPLANT
PAD CAST 4YDX4 CTTN HI CHSV (CAST SUPPLIES) ×1 IMPLANT
PADDING CAST COTTON 4X4 STRL (CAST SUPPLIES) ×3
PADDING CAST COTTON 6X4 STRL (CAST SUPPLIES) ×3 IMPLANT
SCREW CORT TI DBL LEAD 5X32 (Screw) ×2 IMPLANT
SCREW CORT TI DBL LEAD 5X65 (Screw) ×2 IMPLANT
SCREW CORT TI DBL LEAD 5X70 (Screw) ×4 IMPLANT
SCREW CORT TI DBL LEAD 5X85 (Screw) ×2 IMPLANT
STAPLER VISISTAT 35W (STAPLE) IMPLANT
SUT MNCRL AB 4-0 PS2 18 (SUTURE) ×3 IMPLANT
SUT MON AB 2-0 CT1 27 (SUTURE) ×3 IMPLANT
SUT VIC AB 0 CT1 27 (SUTURE) ×6
SUT VIC AB 0 CT1 27XBRD ANBCTR (SUTURE) ×2 IMPLANT
TOWEL OR 17X24 6PK STRL BLUE (TOWEL DISPOSABLE) ×3 IMPLANT
TOWEL OR 17X26 10 PK STRL BLUE (TOWEL DISPOSABLE) ×6 IMPLANT
TOWEL OR NON WOVEN STRL DISP B (DISPOSABLE) ×3 IMPLANT
TRAY FOLEY W/METER SILVER 16FR (SET/KITS/TRAYS/PACK) IMPLANT
WATER STERILE IRR 1000ML POUR (IV SOLUTION) ×6 IMPLANT

## 2017-09-28 NOTE — H&P (View-Only) (Signed)
ORTHOPAEDIC CONSULTATION  REQUESTING PHYSICIAN: Kayleen Memos, DO  Chief Complaint: Left leg pain  HPI: Todd Coleman is a 77 y.o. male who complains of left leg pain after falling from his motorized wheelchair onto his left leg.  He presented to the emergency department where x-rays showed an acute distal femur fracture.  Orthopedics was consulted for evaluation.   Today his pain is not well controlled..  Last meal 09/27/2017 at lunch. Denies history of CVA, DVT, PE.  He reports a "mild heart attack during amputation surgery performed by Dr. Maryjean Morn in [facility].  Previously ambulatory utilizing a rolling and motorized wheelchair.  He does have prosthetics, but he rarely uses them.  The patient was living with his brother.    Past Medical History:  Diagnosis Date  . Diabetes mellitus   . Dialysis patient ([place])   . Hypertension   . Renal failure    Past Surgical History:  Procedure Laterality Date  . BELOW KNEE LEG AMPUTATION    . CARDIAC CATHETERIZATION    . TONSILLECTOMY     Social History   Socioeconomic History  . Marital status: Legally Separated    Spouse name: Not on file  . Number of children: Not on file  . Years of education: Not on file  . Highest education level: Not on file  Occupational History  . Not on file  Social Needs  . Financial resource strain: Not on file  . Food insecurity:    Worry: Not on file    Inability: Not on file  . Transportation needs:    Medical: Not on file    Non-medical: Not on file  Tobacco Use  . Smoking status: Former Research scientist (life sciences)  . Smokeless tobacco: Never Used  Substance and Sexual Activity  . Alcohol use: No  . Drug use: No  . Sexual activity: Not Currently  Lifestyle  . Physical activity:    Days per week: Not on file    Minutes per session: Not on file  . Stress: Not on file  Relationships  . Social connections:    Talks on phone: Not on file    Gets together: Not on file    Attends religious service: Not on  file    Active member of club or organization: Not on file    Attends meetings of clubs or organizations: Not on file    Relationship status: Not on file  Other Topics Concern  . Not on file  Social History Narrative  . Not on file   Family History  Problem Relation Age of Onset  . Diabetes Other    Allergies  Allergen Reactions  . Dilaudid [Hydromorphone Hcl] Other (See Comments)    "makes me crazy".  . Penicillins Rash  . Ramipril Rash   Prior to Admission medications   Medication Sig Start Date End Date Taking? Authorizing Provider  Ascorbic Acid (VITAMIN C) 100 MG tablet Take 100 mg by mouth every evening.      [provider]  aspirin 81 MG EC tablet Take 81 mg by mouth every evening.      [provider]  atorvastatin (LIPITOR) 80 MG tablet Take 80 mg by mouth every evening.      [provider]  calcitRIOL (ROCALTROL) 0.25 MCG capsule Take 0.25-0.5 mcg by mouth daily. Take 2 tabs Mon-Sat and take 1 tab on Sun     [provider]  calcium elemental as carbonate (TUMS ULTRA 1000) 400 MG  tablet Chew 1,000 mg by mouth 3 (three) times daily.      [provider]  cloNIDine (CATAPRES - DOSED IN MG/24 HR) 0.3 mg/24hr Place 1 patch onto the skin once a week. Change every Mon     [provider]  clopidogrel (PLAVIX) 75 MG tablet Take 75 mg by mouth every evening.      [provider]  doxazosin (CARDURA) 2 MG tablet Take 2 mg by mouth daily.      [provider]  fish oil-omega-3 fatty acids 1000 MG capsule Take 1 g by mouth 2 (two) times daily.      [provider]  glipiZIDE (GLUCOTROL XL) 5 MG 24 hr tablet Take 5 mg by mouth daily.      [provider]  hydrALAZINE (APRESOLINE) 25 MG tablet Take 25 mg by mouth 3 (three) times daily.      [provider]  insulin glargine (LANTUS SOLOSTAR) 100 UNIT/ML injection Inject 20 Units into the skin at bedtime.      [provider]    labetalol (NORMODYNE) 200 MG tablet Take 200 mg by mouth 2 (two) times daily.     [provider]  Multiple Vitamins-Minerals (MULTIVITAL PO) Take 1 tablet by mouth daily.      [provider]  torsemide (DEMADEX) 20 MG tablet Take 40 mg by mouth daily.    [provider]  vitamin E 400 UNIT capsule Take 400 Units by mouth every evening.      [provider]  Zinc 50 MG CAPS Take 1 capsule by mouth every evening.      [provider]   Dg Knee 1-2 Views Left  Result Date: 09/27/2017 CLINICAL DATA:  LEFT hip and knee pain after fall from wheelchair. History of LEFT hip surgery. EXAM: LEFT KNEE - 1-2 VIEW COMPARISON:  None available for comparison at time of study interpretation. FINDINGS: Limited assessment due to nonstandard views, per technologist patient was in pain and had difficulty maintaining position for imaging. Status post LEFT below-knee amputation. Acute comminuted impacted femoral metadiaphysis fracture. No definite intra-articular extension. Osteopenia. No destructive bony lesions. Severe vascular calcification and stent and popliteal fossa. IMPRESSION: Acute distal femur fracture.  No dislocation. Status post below-knee amputation. Electronically Signed   By: Elon Alas M.D.   On: 09/27/2017 20:48   Ct Head Wo Contrast  Result Date: 09/27/2017 CLINICAL DATA:  Fall from wheelchair. EXAM: CT HEAD WITHOUT CONTRAST CT CERVICAL SPINE WITHOUT CONTRAST TECHNIQUE: Multidetector CT imaging of the head and cervical spine was performed following the standard protocol without intravenous contrast. Multiplanar CT image reconstructions of the cervical spine were also generated. COMPARISON:  08/03/2016 FINDINGS: CT HEAD FINDINGS Brain: There is diffuse low attenuation throughout the subcortical and periventricular white matter compatible with chronic small vessel ischemic disease. Prominence of the sulci and ventricles are noted compatible with brain  atrophy. No evidence for acute infarction, hemorrhage, hydrocephalus, extra-axial collection or mass lesion/mass effect. Vascular: No hyperdense vessel or unexpected calcification. Skull: Normal. Negative for fracture or focal lesion. Sinuses/Orbits: There is mild mucosal thickening involving the right maxillary sinus. Partial opacification of the left mastoid air cells. Other: None CT CERVICAL SPINE FINDINGS Alignment: Normal. Skull base and vertebrae: No acute fracture. No primary bone lesion or focal pathologic process. Soft tissues and spinal canal: No prevertebral fluid or swelling. No visible canal hematoma. Disc levels: Disc space narrowing and ventral spurring noted at C5-6 and C6-7. Upper chest: Negative.  Other: None IMPRESSION: 1. No acute intracranial abnormalities. Chronic small vessel ischemic disease and brain atrophy noted. 2. No evidence for cervical spine fracture or dislocation. 3. 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 Electronically Signed   By: Kerby Moors M.D.   On: 09/27/2017 20:50   Ct Cervical Spine Wo Contrast  Result Date: 09/27/2017 CLINICAL DATA:  Fall from wheelchair. EXAM: CT HEAD WITHOUT CONTRAST CT CERVICAL SPINE WITHOUT CONTRAST TECHNIQUE: Multidetector CT imaging of the head and cervical spine was performed following the standard protocol without intravenous contrast. Multiplanar CT image reconstructions of the cervical spine were also generated. COMPARISON:  08/03/2016 FINDINGS: CT HEAD FINDINGS Brain: There is diffuse low attenuation throughout the subcortical and periventricular white matter compatible with chronic small vessel ischemic disease. Prominence of the sulci and ventricles are noted compatible with brain atrophy. No evidence for acute infarction, hemorrhage, hydrocephalus, extra-axial collection or mass lesion/mass effect. Vascular: No hyperdense vessel or unexpected calcification. Skull: Normal. Negative for fracture or focal lesion.  Sinuses/Orbits: There is mild mucosal thickening involving the right maxillary sinus. Partial opacification of the left mastoid air cells. Other: None CT CERVICAL SPINE FINDINGS Alignment: Normal. Skull base and vertebrae: No acute fracture. No primary bone lesion or focal pathologic process. Soft tissues and spinal canal: No prevertebral fluid or swelling. No visible canal hematoma. Disc levels: Disc space narrowing and ventral spurring noted at C5-6 and C6-7. Upper chest: Negative. Other: None IMPRESSION: 1. No acute intracranial abnormalities. Chronic small vessel ischemic disease and brain atrophy noted. 2. No evidence for cervical spine fracture or dislocation. 3. 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 Electronically Signed   By: Kerby Moors M.D.   On: 09/27/2017 20:50   Dg Chest Port 1 View  Result Date: 09/27/2017 CLINICAL DATA:  Fall out of wheelchair today. EXAM: PORTABLE CHEST 1 VIEW COMPARISON:  Radiograph 08/30/2017 FINDINGS: Low lung volumes. Similar in size and mediastinal contours to prior exam allowing for differences in technique. Improved pulmonary edema and vascular congestion. Streaky left lung base atelectasis or scarring. No focal airspace disease. No pneumothorax or large pleural effusion. Vascular stent in the left axilla again seen. No evidence of acute osseous abnormality. IMPRESSION: Low lung volumes without acute abnormality or evidence of acute traumatic injury. Electronically Signed   By: Jeb Levering M.D.   On: 09/27/2017 21:38   Dg Hip Unilat W Or Wo Pelvis 2-3 Views Left  Result Date: 09/27/2017 CLINICAL DATA:  Left hip and knee pain after fall from wheelchair today. EXAM: DG HIP (WITH OR WITHOUT PELVIS) 2-3V LEFT COMPARISON:  06/13/2016 intraoperative radiograph of the left hip. FINDINGS: Four cannulated screws are noted along the long axis of the left femoral neck traversing a chronic subcapital impacted left femoral neck fracture. No change in  configuration. No evidence of acute fracture, hardware failure or joint dislocation. Intact bony pelvis. Lower lumbar facet arthropathy at L5-S1. Aorto-bi-iliofemoral atherosclerosis with vascular surgical clips projecting over the groin bilaterally. IMPRESSION: Negative for acute fracture or malalignment. Intact left femoral neck fixation. Electronically Signed   By: Ashley Royalty M.D.   On: 09/27/2017 20:19    Positive ROS: All other systems have been reviewed and were otherwise negative with the exception of those mentioned in the HPI and as above.  Objective: Labs cbc Recent Labs    09/27/17 2123  WBC 8.9  HGB 12.0*  HCT 36.4*  PLT 230    Labs inflam No results for input(s): CRP in the last  72 hours.  Invalid input(s): ESR  Labs coag No results for input(s): INR, PTT in the last 72 hours.  Invalid input(s): PT  Recent Labs    09/27/17 2123  NA 133*  K 4.4  CL 91*  CO2 25  GLUCOSE 243*  BUN 26*  CREATININE 6.28*  CALCIUM 9.2    Physical Exam: Vitals:   09/28/17 0102 09/28/17 0620  BP: (!) 153/92 (!) 150/67  Pulse: (!) 114 (!) 103  Resp: (!) 22 19  Temp: 97.9 F (36.6 C) 98.1 F (36.7 C)  SpO2: 100% 100%   General: Alert, uncomfortable appearing but in no acute distress.   Mental status: Alert and Oriented x3 Neurologic: Speech Clear and organized, no gross focal findings or movement disorder appreciated. Respiratory: No cyanosis, no use of accessory musculature Cardiovascular: High normal rate GI: Abdomen is soft and non-tender, non-distended. Skin: Warm and dry.  No lesions in the area of chief complaint . Extremities: Warm w/o edema Psychiatric: Patient is competent for consent with normal mood and affect  MUSCULOSKELETAL:  Chronic BKA.  Pain with movement of the left lower extremity.  Left thigh/knee without lesion.  Thigh swollen but compressible.  Sensation intact distally to injury.   Assessment / Plan: Active Problems:   ESRD (end stage renal  disease) (HCC)   S/P bilateral BKA (below knee amputation) (HCC)   HTN (hypertension)   DM (diabetes mellitus), type 2 with renal complications (HCC)   CAD (coronary artery disease)   PAD (peripheral artery disease) (HCC)   Closed left femoral fracture (HCC)   Femoral fracture (HCC)   Prolonged QT interval   Left distal femur fracture status post left BKA Plan for operative fixation by Dr. Alain Marion today. N.p.o. Nonweightbearing Previously on Plavix and ASA 81 mg-Plavix held.  Will update DVT prophylaxis postoperatively.  The risks benefits and alternatives of the procedure were discussed with the patient including but not limited to infection, bleeding, nerve injury, the need for revision surgery, blood clots, cardiopulmonary complications, morbidity, mortality, among others.  The patient verbalizes understanding and wishes to proceed.     Contact information:  Edmonia Lynch MD, Roxan Hockey PA-C  Prudencio Burly III PA-C 09/28/2017 6:56 AM
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
## 2017-09-28 NOTE — Consult Note (Signed)
ORTHOPAEDIC CONSULTATION  REQUESTING PHYSICIAN: Kayleen Memos, DO  Chief Complaint: Left leg pain  HPI: Todd Coleman is a 77 y.o. male who complains of left leg pain after falling from his motorized wheelchair onto his left leg.  He presented to the emergency department where x-rays showed an acute distal femur fracture.  Orthopedics was consulted for evaluation.   Today his pain is not well controlled..  Last meal 09/27/2017 at lunch. Denies history of CVA, DVT, PE.  He reports a "mild heart attack during amputation surgery performed by Dr. Maryjean Morn in [facility].  Previously ambulatory utilizing a rolling and motorized wheelchair.  He does have prosthetics, but he rarely uses them.  The patient was living with his brother.    Past Medical History:  Diagnosis Date  . Diabetes mellitus   . Dialysis patient ([facility])   . Hypertension   . Renal failure    Past Surgical History:  Procedure Laterality Date  . BELOW KNEE LEG AMPUTATION    . CARDIAC CATHETERIZATION    . TONSILLECTOMY     Social History   Socioeconomic History  . Marital status: Legally Separated    Spouse name: Not on file  . Number of children: Not on file  . Years of education: Not on file  . Highest education level: Not on file  Occupational History  . Not on file  Social Needs  . Financial resource strain: Not on file  . Food insecurity:    Worry: Not on file    Inability: Not on file  . Transportation needs:    Medical: Not on file    Non-medical: Not on file  Tobacco Use  . Smoking status: Former Research scientist (life sciences)  . Smokeless tobacco: Never Used  Substance and Sexual Activity  . Alcohol use: No  . Drug use: No  . Sexual activity: Not Currently  Lifestyle  . Physical activity:    Days per week: Not on file    Minutes per session: Not on file  . Stress: Not on file  Relationships  . Social connections:    Talks on phone: Not on file    Gets together: Not on file    Attends religious service: Not on  file    Active member of club or organization: Not on file    Attends meetings of clubs or organizations: Not on file    Relationship status: Not on file  Other Topics Concern  . Not on file  Social History Narrative  . Not on file   Family History  Problem Relation Age of Onset  . Diabetes Other    Allergies  Allergen Reactions  . Dilaudid [Hydromorphone Hcl] Other (See Comments)    "makes me crazy".  . Penicillins Rash  . Ramipril Rash   Prior to Admission medications   Medication Sig Start Date End Date Taking? Authorizing Provider  Ascorbic Acid (VITAMIN C) 100 MG tablet Take 100 mg by mouth every evening.      [provider]  aspirin 81 MG EC tablet Take 81 mg by mouth every evening.      [provider]  atorvastatin (LIPITOR) 80 MG tablet Take 80 mg by mouth every evening.      [provider]  calcitRIOL (ROCALTROL) 0.25 MCG capsule Take 0.25-0.5 mcg by mouth daily. Take 2 tabs Mon-Sat and take 1 tab on Sun     [provider]  calcium elemental as carbonate (TUMS ULTRA 1000) 400 MG  tablet Chew 1,000 mg by mouth 3 (three) times daily.      [provider]  cloNIDine (CATAPRES - DOSED IN MG/24 HR) 0.3 mg/24hr Place 1 patch onto the skin once a week. Change every Mon     [provider]  clopidogrel (PLAVIX) 75 MG tablet Take 75 mg by mouth every evening.      [provider]  doxazosin (CARDURA) 2 MG tablet Take 2 mg by mouth daily.      [provider]  fish oil-omega-3 fatty acids 1000 MG capsule Take 1 g by mouth 2 (two) times daily.      [provider]  glipiZIDE (GLUCOTROL XL) 5 MG 24 hr tablet Take 5 mg by mouth daily.      [provider]  hydrALAZINE (APRESOLINE) 25 MG tablet Take 25 mg by mouth 3 (three) times daily.      [provider]  insulin glargine (LANTUS SOLOSTAR) 100 UNIT/ML injection Inject 20 Units into the skin at bedtime.      [provider]    labetalol (NORMODYNE) 200 MG tablet Take 200 mg by mouth 2 (two) times daily.     [provider]  Multiple Vitamins-Minerals (MULTIVITAL PO) Take 1 tablet by mouth daily.      [provider]  torsemide (DEMADEX) 20 MG tablet Take 40 mg by mouth daily.    [provider]  vitamin E 400 UNIT capsule Take 400 Units by mouth every evening.      [provider]  Zinc 50 MG CAPS Take 1 capsule by mouth every evening.      [provider]   Dg Knee 1-2 Views Left  Result Date: 09/27/2017 CLINICAL DATA:  LEFT hip and knee pain after fall from wheelchair. History of LEFT hip surgery. EXAM: LEFT KNEE - 1-2 VIEW COMPARISON:  None available for comparison at time of study interpretation. FINDINGS: Limited assessment due to nonstandard views, per technologist patient was in pain and had difficulty maintaining position for imaging. Status post LEFT below-knee amputation. Acute comminuted impacted femoral metadiaphysis fracture. No definite intra-articular extension. Osteopenia. No destructive bony lesions. Severe vascular calcification and stent and popliteal fossa. IMPRESSION: Acute distal femur fracture.  No dislocation. Status post below-knee amputation. Electronically Signed   By: Elon Alas M.D.   On: 09/27/2017 20:48   Ct Head Wo Contrast  Result Date: 09/27/2017 CLINICAL DATA:  Fall from wheelchair. EXAM: CT HEAD WITHOUT CONTRAST CT CERVICAL SPINE WITHOUT CONTRAST TECHNIQUE: Multidetector CT imaging of the head and cervical spine was performed following the standard protocol without intravenous contrast. Multiplanar CT image reconstructions of the cervical spine were also generated. COMPARISON:  08/03/2016 FINDINGS: CT HEAD FINDINGS Brain: There is diffuse low attenuation throughout the subcortical and periventricular white matter compatible with chronic small vessel ischemic disease. Prominence of the sulci and ventricles are noted compatible with brain  atrophy. No evidence for acute infarction, hemorrhage, hydrocephalus, extra-axial collection or mass lesion/mass effect. Vascular: No hyperdense vessel or unexpected calcification. Skull: Normal. Negative for fracture or focal lesion. Sinuses/Orbits: There is mild mucosal thickening involving the right maxillary sinus. Partial opacification of the left mastoid air cells. Other: None CT CERVICAL SPINE FINDINGS Alignment: Normal. Skull base and vertebrae: No acute fracture. No primary bone lesion or focal pathologic process. Soft tissues and spinal canal: No prevertebral fluid or swelling. No visible canal hematoma. Disc levels: Disc space narrowing and ventral spurring noted at C5-6 and C6-7. Upper chest: Negative.  Other: None IMPRESSION: 1. No acute intracranial abnormalities. Chronic small vessel ischemic disease and brain atrophy noted. 2. No evidence for cervical spine fracture or dislocation. 3. 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 Electronically Signed   By: Kerby Moors M.D.   On: 09/27/2017 20:50   Ct Cervical Spine Wo Contrast  Result Date: 09/27/2017 CLINICAL DATA:  Fall from wheelchair. EXAM: CT HEAD WITHOUT CONTRAST CT CERVICAL SPINE WITHOUT CONTRAST TECHNIQUE: Multidetector CT imaging of the head and cervical spine was performed following the standard protocol without intravenous contrast. Multiplanar CT image reconstructions of the cervical spine were also generated. COMPARISON:  08/03/2016 FINDINGS: CT HEAD FINDINGS Brain: There is diffuse low attenuation throughout the subcortical and periventricular white matter compatible with chronic small vessel ischemic disease. Prominence of the sulci and ventricles are noted compatible with brain atrophy. No evidence for acute infarction, hemorrhage, hydrocephalus, extra-axial collection or mass lesion/mass effect. Vascular: No hyperdense vessel or unexpected calcification. Skull: Normal. Negative for fracture or focal lesion.  Sinuses/Orbits: There is mild mucosal thickening involving the right maxillary sinus. Partial opacification of the left mastoid air cells. Other: None CT CERVICAL SPINE FINDINGS Alignment: Normal. Skull base and vertebrae: No acute fracture. No primary bone lesion or focal pathologic process. Soft tissues and spinal canal: No prevertebral fluid or swelling. No visible canal hematoma. Disc levels: Disc space narrowing and ventral spurring noted at C5-6 and C6-7. Upper chest: Negative. Other: None IMPRESSION: 1. No acute intracranial abnormalities. Chronic small vessel ischemic disease and brain atrophy noted. 2. No evidence for cervical spine fracture or dislocation. 3. 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 Electronically Signed   By: Kerby Moors M.D.   On: 09/27/2017 20:50   Dg Chest Port 1 View  Result Date: 09/27/2017 CLINICAL DATA:  Fall out of wheelchair today. EXAM: PORTABLE CHEST 1 VIEW COMPARISON:  Radiograph 08/30/2017 FINDINGS: Low lung volumes. Similar in size and mediastinal contours to prior exam allowing for differences in technique. Improved pulmonary edema and vascular congestion. Streaky left lung base atelectasis or scarring. No focal airspace disease. No pneumothorax or large pleural effusion. Vascular stent in the left axilla again seen. No evidence of acute osseous abnormality. IMPRESSION: Low lung volumes without acute abnormality or evidence of acute traumatic injury. Electronically Signed   By: Jeb Levering M.D.   On: 09/27/2017 21:38   Dg Hip Unilat W Or Wo Pelvis 2-3 Views Left  Result Date: 09/27/2017 CLINICAL DATA:  Left hip and knee pain after fall from wheelchair today. EXAM: DG HIP (WITH OR WITHOUT PELVIS) 2-3V LEFT COMPARISON:  06/13/2016 intraoperative radiograph of the left hip. FINDINGS: Four cannulated screws are noted along the long axis of the left femoral neck traversing a chronic subcapital impacted left femoral neck fracture. No change in  configuration. No evidence of acute fracture, hardware failure or joint dislocation. Intact bony pelvis. Lower lumbar facet arthropathy at L5-S1. Aorto-bi-iliofemoral atherosclerosis with vascular surgical clips projecting over the groin bilaterally. IMPRESSION: Negative for acute fracture or malalignment. Intact left femoral neck fixation. Electronically Signed   By: Ashley Royalty M.D.   On: 09/27/2017 20:19    Positive ROS: All other systems have been reviewed and were otherwise negative with the exception of those mentioned in the HPI and as above.  Objective: Labs cbc Recent Labs    09/27/17 2123  WBC 8.9  HGB 12.0*  HCT 36.4*  PLT 230    Labs inflam No results for input(s): CRP in the last  72 hours.  Invalid input(s): ESR  Labs coag No results for input(s): INR, PTT in the last 72 hours.  Invalid input(s): PT  Recent Labs    09/27/17 2123  NA 133*  K 4.4  CL 91*  CO2 25  GLUCOSE 243*  BUN 26*  CREATININE 6.28*  CALCIUM 9.2    Physical Exam: Vitals:   09/28/17 0102 09/28/17 0620  BP: (!) 153/92 (!) 150/67  Pulse: (!) 114 (!) 103  Resp: (!) 22 19  Temp: 97.9 F (36.6 C) 98.1 F (36.7 C)  SpO2: 100% 100%   General: Alert, uncomfortable appearing but in no acute distress.   Mental status: Alert and Oriented x3 Neurologic: Speech Clear and organized, no gross focal findings or movement disorder appreciated. Respiratory: No cyanosis, no use of accessory musculature Cardiovascular: High normal rate GI: Abdomen is soft and non-tender, non-distended. Skin: Warm and dry.  No lesions in the area of chief complaint . Extremities: Warm w/o edema Psychiatric: Patient is competent for consent with normal mood and affect  MUSCULOSKELETAL:  Chronic BKA.  Pain with movement of the left lower extremity.  Left thigh/knee without lesion.  Thigh swollen but compressible.  Sensation intact distally to injury.   Assessment / Plan: Active Problems:   ESRD (end stage renal  disease) (HCC)   S/P bilateral BKA (below knee amputation) (HCC)   HTN (hypertension)   DM (diabetes mellitus), type 2 with renal complications (HCC)   CAD (coronary artery disease)   PAD (peripheral artery disease) (HCC)   Closed left femoral fracture (HCC)   Femoral fracture (HCC)   Prolonged QT interval   Left distal femur fracture status post left BKA Plan for operative fixation by Dr. Alain Marion today. N.p.o. Nonweightbearing Previously on Plavix and ASA 81 mg-Plavix held.  Will update DVT prophylaxis postoperatively.  The risks benefits and alternatives of the procedure were discussed with the patient including but not limited to infection, bleeding, nerve injury, the need for revision surgery, blood clots, cardiopulmonary complications, morbidity, mortality, among others.  The patient verbalizes understanding and wishes to proceed.     Contact information:  Edmonia Lynch MD, Roxan Hockey PA-C  Prudencio Burly III PA-C 09/28/2017 6:56 AM

## 2017-09-28 NOTE — Anesthesia Procedure Notes (Signed)
Anesthesia Regional Block: Femoral nerve block   Pre-Anesthetic Checklist: ,, timeout performed, Correct Patient, Correct Site, Correct Laterality, Correct Procedure,, site marked, risks and benefits discussed, Surgical consent,  Pre-op evaluation,  At surgeon's request and post-op pain management  Laterality: Left  Prep: chloraprep       Needles:  Injection technique: Single-shot  Needle Type: Echogenic Stimulator Needle     Needle Length: 9cm  Needle Gauge: 21     Additional Needles:   Procedures:, nerve stimulator,,,, intact distal pulses,,,   Nerve Stimulator or Paresthesia:  Response: Quadriceps muscle contraction, 0.45 mA,   Additional Responses:   Narrative:  Start time: 09/28/2017 11:20 AM End time: 09/28/2017 11:27 AM Injection made incrementally with aspirations every 5 mL.  Performed by: Personally  Anesthesiologist: Achille RichHodierne, Kiauna Zywicki, MD  Additional Notes: Functioning IV was confirmed and monitors were applied.  A 90mm 21ga Arrow echogenic stimulator needle was used. Sterile prep and drape,hand hygiene and sterile gloves were used.  Negative aspiration and negative test dose prior to incremental administration of local anesthetic. The patient tolerated the procedure well.

## 2017-09-28 NOTE — Anesthesia Preprocedure Evaluation (Signed)
Anesthesia Evaluation  Patient identified by MRN, date of birth, ID band Patient awake    Reviewed: Allergy & Precautions, H&P , NPO status , Patient's Chart, lab work & pertinent test results  Airway Mallampati: II   Neck ROM: full    Dental   Pulmonary former smoker,    breath sounds clear to auscultation       Cardiovascular hypertension, + CAD and + Peripheral Vascular Disease   Rhythm:regular Rate:Normal     Neuro/Psych    GI/Hepatic   Endo/Other  diabetes, Type 2  Renal/GU ESRF and DialysisRenal disease     Musculoskeletal   Abdominal   Peds  Hematology   Anesthesia Other Findings   Reproductive/Obstetrics                             Anesthesia Physical Anesthesia Plan  ASA: III  Anesthesia Plan: General   Post-op Pain Management:  Regional for Post-op pain   Induction: Intravenous  PONV Risk Score and Plan: 2  Airway Management Planned: LMA  Additional Equipment:   Intra-op Plan:   Post-operative Plan:   Informed Consent: I have reviewed the patients History and Physical, chart, labs and discussed the procedure including the risks, benefits and alternatives for the proposed anesthesia with the patient or authorized representative who has indicated his/her understanding and acceptance.     Plan Discussed with: CRNA, Anesthesiologist and Surgeon  Anesthesia Plan Comments:         Anesthesia Quick Evaluation

## 2017-09-28 NOTE — Transfer of Care (Signed)
Immediate Anesthesia Transfer of Care Note  Patient: Todd RobertsonDaniel Coleman  Procedure(s) Performed: OPEN REDUCTION INTERNAL FIXATION (ORIF) DISTAL FEMUR FRACTURE (Left )  Patient Location: PACU  Anesthesia Type:General  Level of Consciousness: awake, alert , oriented, drowsy and patient cooperative  Airway & Oxygen Therapy: Patient Spontanous Breathing and Patient connected to nasal cannula oxygen  Post-op Assessment: Report given to RN and Post -op Vital signs reviewed and stable  Post vital signs: Reviewed and stable  Last Vitals:  Vitals Value Taken Time  BP 82/53 09/28/2017  2:58 PM  Temp    Pulse 98 09/28/2017  3:03 PM  Resp 19 09/28/2017  3:03 PM  SpO2 96 % 09/28/2017  3:03 PM  Vitals shown include unvalidated device data.  Last Pain:  Vitals:   09/28/17 0821  TempSrc:   PainSc: 6       Patients Stated Pain Goal: 3 (09/28/17 0550)  Complications: No apparent anesthesia complications

## 2017-09-28 NOTE — Anesthesia Procedure Notes (Signed)
Procedure Name: LMA Insertion Date/Time: 09/28/2017 1:15 PM Performed by: Yolonda Kidaarver, Falecia Vannatter L, CRNA Pre-anesthesia Checklist: Patient identified, Emergency Drugs available, Suction available and Patient being monitored Patient Re-evaluated:Patient Re-evaluated prior to induction Oxygen Delivery Method: Circle system utilized Preoxygenation: Pre-oxygenation with 100% oxygen Induction Type: IV induction LMA: LMA inserted LMA Size: 4.0 Number of attempts: 1 Placement Confirmation: positive ETCO2,  CO2 detector and breath sounds checked- equal and bilateral Tube secured with: Tape Dental Injury: Teeth and Oropharynx as per pre-operative assessment

## 2017-09-28 NOTE — Interval H&P Note (Signed)
History and Physical Interval Note:  09/28/2017 9:44 AM  Todd Robertsonaniel Holford  has presented today for surgery, with the diagnosis of left femur fracture  The various methods of treatment have been discussed with the patient and family. After consideration of risks, benefits and other options for treatment, the patient has consented to  Procedure(s): OPEN REDUCTION INTERNAL FIXATION (ORIF) DISTAL FEMUR FRACTURE (Left) as a surgical intervention .  The patient's history has been reviewed, patient examined, no change in status, stable for surgery.  I have reviewed the patient's chart and labs.  Questions were answered to the patient's satisfaction.     Collene Massimino D

## 2017-09-28 NOTE — Progress Notes (Signed)
PROGRESS NOTE  Todd RobertsonDaniel Delisi ZOX:[id number] DOB: [birth year] DOA: 09/27/2017 PCP: Romie JumperBarden, Lucy P, PA-C  HPI/Recap of past 24 hours: Todd Coleman is a 77 y.o. male with medical history significant of CAD, ESRD on HD MWF, HTN, HLD, DM2, Barrett's esophagus  Presented with a fall that occurred today patient fell from his motorized chair landed on his left knee and face denies LOC patient is on dialysis Monday Wednesday Friday last dialysis was today using left shunt.  09/28/2017: Patient seen and examined at his bedside.  He reports severe pain in his left lower extremity.  He is due for a dose of his pain medication.  He has no other complaints.   Assessment/Plan: Principal Problem:   Closed left femoral fracture (HCC) Active Problems:   ESRD (end stage renal disease) (HCC)   S/P bilateral BKA (below knee amputation) (HCC)   HTN (hypertension)   DM (diabetes mellitus), type 2 with renal complications (HCC)   CAD (coronary artery disease)   PAD (peripheral artery disease) (HCC)   Femoral fracture (HCC)   Prolonged QT interval  Left distal femur fracture status post mechanical fall POD #0 status post open reduction internal fixation of distal femur fracture Pain management in place with IV fentanyl prn Avoid morphine, tramadol Continue telemetry  End-stage renal disease on hemodialysis Monday Wednesday Friday Avoid Morphine/tramadol for narcotic in the end-stage renal disease patient  Hypertention Hypotensive post surgery Continue to monitor VS closely Hold antihypertensive meds  Coronary artery disease status post PCI Continue plavix, statin Cardiology consulted for surgical clearance  Ambulatory dysfunction status post bilateral lower extremity amputation PT to evaluate and treat Fall precautions     Code Status: Full code  Family Communication: None at bedside  Disposition Plan: SNF when clinically stable   Consultants:  Orthopedic surgery  Procedures:  POD  #0 status post open reduction internal fixation of distal femur fracture  Antimicrobials:  None  DVT prophylaxis: SCDs   Objective: Vitals:   09/28/17 0821 09/28/17 1130 09/28/17 1457 09/28/17 1500  BP: 137/85 (!) 106/52 (!) 79/59 (!) 82/53  Pulse:  (!) 102 (!) 105 (!) 103  Resp:  12 (!) 29 14  Temp:   97.7 F (36.5 C)   TempSrc:      SpO2:  93% 90% (!) 89%  Weight:      Height:        Intake/Output Summary (Last 24 hours) at 09/28/2017 1520 Last data filed at 09/28/2017 1446 Gross per 24 hour  Intake 400 ml  Output 300 ml  Net 100 ml   Filed Weights   09/27/17 1835  Weight: 87.1 kg (192 lb)    Exam:   General: 77 year old Caucasian male well-developed well-nourished bilateral lower extremity amputee.  Alert and oriented x3.  Cardiovascular: Regular rate and rhythm with no rubs or gallops.  No JVD or thyromegaly present.  Respiratory: Clear to auscultation with no wheezes or rales.  Abdomen: Soft nontender nondistended with normal bowel sounds x4 quadrant.  Musculoskeletal: Bilateral lower extremity amputee left lower extremities mildly more distended than the right  Skin: No ulcerative lesions noted.  Bruising affecting the mid part of his face  Psychiatry: Mood is appropriate for condition and setting.   Data Reviewed: CBC: Recent Labs  Lab 09/27/17 2123  WBC 8.9  NEUTROABS 7.4  HGB 12.0*  HCT 36.4*  MCV 101.1*  PLT 230   Basic Metabolic Panel: Recent Labs  Lab 09/27/17 2123  NA 133*  K 4.4  CL  91*  CO2 25  GLUCOSE 243*  BUN 26*  CREATININE 6.28*  CALCIUM 9.2   GFR: Estimated Creatinine Clearance: 10.5 mL/min (A) (by C-G formula based on SCr of 6.28 mg/dL (H)). Liver Function Tests: Recent Labs  Lab 09/27/17 2123  AST 45*  ALT 20  ALKPHOS 53  BILITOT 1.0  PROT 7.3  ALBUMIN 3.5   No results for input(s): LIPASE, AMYLASE in the last 168 hours. No results for input(s): AMMONIA in the last 168 hours. Coagulation Profile: No  results for input(s): INR, PROTIME in the last 168 hours. Cardiac Enzymes: No results for input(s): CKTOTAL, CKMB, CKMBINDEX, TROPONINI in the last 168 hours. BNP (last 3 results) No results for input(s): PROBNP in the last 8760 hours. HbA1C: Recent Labs    09/28/17 0528  HGBA1C 6.2*   CBG: Recent Labs  Lab 09/28/17 0116 09/28/17 0548 09/28/17 0819 09/28/17 0943  GLUCAP 246* 248* 200* 168*   Lipid Profile: No results for input(s): CHOL, HDL, LDLCALC, TRIG, CHOLHDL, LDLDIRECT in the last 72 hours. Thyroid Function Tests: No results for input(s): TSH, T4TOTAL, FREET4, T3FREE, THYROIDAB in the last 72 hours. Anemia Panel: No results for input(s): VITAMINB12, FOLATE, FERRITIN, TIBC, IRON, RETICCTPCT in the last 72 hours. Urine analysis: No results found for: COLORURINE, APPEARANCEUR, LABSPEC, PHURINE, GLUCOSEU, HGBUR, BILIRUBINUR, KETONESUR, PROTEINUR, UROBILINOGEN, NITRITE, LEUKOCYTESUR Sepsis Labs: @LABRCNTIP (procalcitonin:4,lacticidven:4)  ) Recent Results (from the past 240 hour(s))  Surgical pcr screen     Status: Abnormal   Collection Time: 09/28/17  5:54 AM  Result Value Ref Range Status   MRSA, PCR POSITIVE (A) NEGATIVE Final    Comment: RESULT CALLED TO, READ BACK BY AND VERIFIED WITH: M. Peppeh RN 9:40 09/28/17 ([email or web address])    Staphylococcus aureus POSITIVE (A) NEGATIVE Final    Comment: (NOTE) The Xpert SA Assay (FDA approved for NASAL specimens in patients 41 years of age and older), is one component of a comprehensive surveillance program. It is not intended to diagnose infection nor to guide or monitor treatment.       Studies: Dg Knee 1-2 Views Left  Result Date: 09/27/2017 CLINICAL DATA:  LEFT hip and knee pain after fall from wheelchair. History of LEFT hip surgery. EXAM: LEFT KNEE - 1-2 VIEW COMPARISON:  None available for comparison at time of study interpretation. FINDINGS: Limited assessment due to nonstandard views, per technologist patient was in pain  and had difficulty maintaining position for imaging. Status post LEFT below-knee amputation. Acute comminuted impacted femoral metadiaphysis fracture. No definite intra-articular extension. Osteopenia. No destructive bony lesions. Severe vascular calcification and stent and popliteal fossa. IMPRESSION: Acute distal femur fracture.  No dislocation. Status post below-knee amputation. Electronically Signed   By: Awilda Metro M.D.   On: 09/27/2017 20:48   Ct Head Wo Contrast  Result Date: 09/27/2017 CLINICAL DATA:  Fall from wheelchair. EXAM: CT HEAD WITHOUT CONTRAST CT CERVICAL SPINE WITHOUT CONTRAST TECHNIQUE: Multidetector CT imaging of the head and cervical spine was performed following the standard protocol without intravenous contrast. Multiplanar CT image reconstructions of the cervical spine were also generated. COMPARISON:  08/03/2016 FINDINGS: CT HEAD FINDINGS Brain: There is diffuse low attenuation throughout the subcortical and periventricular white matter compatible with chronic small vessel ischemic disease. Prominence of the sulci and ventricles are noted compatible with brain atrophy. No evidence for acute infarction, hemorrhage, hydrocephalus, extra-axial collection or mass lesion/mass effect. Vascular: No hyperdense vessel or unexpected calcification. Skull: Normal. Negative for fracture or focal lesion. Sinuses/Orbits: There is mild mucosal thickening  involving the right maxillary sinus. Partial opacification of the left mastoid air cells. Other: None CT CERVICAL SPINE FINDINGS Alignment: Normal. Skull base and vertebrae: No acute fracture. No primary bone lesion or focal pathologic process. Soft tissues and spinal canal: No prevertebral fluid or swelling. No visible canal hematoma. Disc levels: Disc space narrowing and ventral spurring noted at C5-6 and C6-7. Upper chest: Negative. Other: None IMPRESSION: 1. No acute intracranial abnormalities. Chronic small vessel ischemic disease and brain  atrophy noted. 2. No evidence for cervical spine fracture or dislocation. 3. 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 Electronically Signed   By: Signa Kell M.D.   On: 09/27/2017 20:50   Ct Cervical Spine Wo Contrast  Result Date: 09/27/2017 CLINICAL DATA:  Fall from wheelchair. EXAM: CT HEAD WITHOUT CONTRAST CT CERVICAL SPINE WITHOUT CONTRAST TECHNIQUE: Multidetector CT imaging of the head and cervical spine was performed following the standard protocol without intravenous contrast. Multiplanar CT image reconstructions of the cervical spine were also generated. COMPARISON:  08/03/2016 FINDINGS: CT HEAD FINDINGS Brain: There is diffuse low attenuation throughout the subcortical and periventricular white matter compatible with chronic small vessel ischemic disease. Prominence of the sulci and ventricles are noted compatible with brain atrophy. No evidence for acute infarction, hemorrhage, hydrocephalus, extra-axial collection or mass lesion/mass effect. Vascular: No hyperdense vessel or unexpected calcification. Skull: Normal. Negative for fracture or focal lesion. Sinuses/Orbits: There is mild mucosal thickening involving the right maxillary sinus. Partial opacification of the left mastoid air cells. Other: None CT CERVICAL SPINE FINDINGS Alignment: Normal. Skull base and vertebrae: No acute fracture. No primary bone lesion or focal pathologic process. Soft tissues and spinal canal: No prevertebral fluid or swelling. No visible canal hematoma. Disc levels: Disc space narrowing and ventral spurring noted at C5-6 and C6-7. Upper chest: Negative. Other: None IMPRESSION: 1. No acute intracranial abnormalities. Chronic small vessel ischemic disease and brain atrophy noted. 2. No evidence for cervical spine fracture or dislocation. 3. 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 Electronically Signed   By: Signa Kell M.D.   On: 09/27/2017 20:50   Dg Chest Port 1 View  Result  Date: 09/27/2017 CLINICAL DATA:  Fall out of wheelchair today. EXAM: PORTABLE CHEST 1 VIEW COMPARISON:  Radiograph 08/30/2017 FINDINGS: Low lung volumes. Similar in size and mediastinal contours to prior exam allowing for differences in technique. Improved pulmonary edema and vascular congestion. Streaky left lung base atelectasis or scarring. No focal airspace disease. No pneumothorax or large pleural effusion. Vascular stent in the left axilla again seen. No evidence of acute osseous abnormality. IMPRESSION: Low lung volumes without acute abnormality or evidence of acute traumatic injury. Electronically Signed   By: Rubye Oaks M.D.   On: 09/27/2017 21:38   Dg C-arm 1-60 Min  Result Date: 09/28/2017 CLINICAL DATA:  77 year old male with distal left femur fracture. Subsequent encounter. EXAM: DG C-ARM 61-120 MIN; LEFT FEMUR 2 VIEWS Fluoroscopic time: 2 minutes and 24 seconds COMPARISON:  09/27/2017 plain film exam. FINDINGS: Four intraoperative C-arm views submitted for review after procedure. Remote left hip pinning. Left femoral intramedullary rod placed across the distal left femur fracture with proximal and distal fixation screws. Fracture fragments slightly separated and angulated. Prominent vascular calcifications. IMPRESSION: Open reduction and internal fixation of distal left femur fracture as detailed above. Electronically Signed   By: Lacy Duverney M.D.   On: 09/28/2017 14:40   Dg Hip Unilat W Or Wo Pelvis 2-3 Views Left  Result Date: 09/27/2017  CLINICAL DATA:  Left hip and knee pain after fall from wheelchair today. EXAM: DG HIP (WITH OR WITHOUT PELVIS) 2-3V LEFT COMPARISON:  06/13/2016 intraoperative radiograph of the left hip. FINDINGS: Four cannulated screws are noted along the long axis of the left femoral neck traversing a chronic subcapital impacted left femoral neck fracture. No change in configuration. No evidence of acute fracture, hardware failure or joint dislocation. Intact bony  pelvis. Lower lumbar facet arthropathy at L5-S1. Aorto-bi-iliofemoral atherosclerosis with vascular surgical clips projecting over the groin bilaterally. IMPRESSION: Negative for acute fracture or malalignment. Intact left femoral neck fixation. Electronically Signed   By: Tollie Eth M.D.   On: 09/27/2017 20:19   Dg Femur Min 2 Views Left  Result Date: 09/28/2017 CLINICAL DATA:  77 year old male with distal left femur fracture. Subsequent encounter. EXAM: DG C-ARM 61-120 MIN; LEFT FEMUR 2 VIEWS Fluoroscopic time: 2 minutes and 24 seconds COMPARISON:  09/27/2017 plain film exam. FINDINGS: Four intraoperative C-arm views submitted for review after procedure. Remote left hip pinning. Left femoral intramedullary rod placed across the distal left femur fracture with proximal and distal fixation screws. Fracture fragments slightly separated and angulated. Prominent vascular calcifications. IMPRESSION: Open reduction and internal fixation of distal left femur fracture as detailed above. Electronically Signed   By: Lacy Duverney M.D.   On: 09/28/2017 14:40    Scheduled Meds: . [MAR Hold] atorvastatin  80 mg Oral QPM  . [MAR Hold] calcitRIOL  0.25 mcg Oral Q Sun  . [MAR Hold] calcitRIOL  0.5 mcg Oral Once per day on Mon Tue Wed Thu Fri Sat  . chlorhexidine  60 mL Topical Once  . [MAR Hold] doxazosin  2 mg Oral Daily  . gabapentin  300 mg Oral Once  . [MAR Hold] insulin aspart  0-9 Units Subcutaneous Q4H  . [MAR Hold] insulin glargine  10 Units Subcutaneous QHS    Continuous Infusions: . lactated ringers Stopped (09/28/17 1306)  . [MAR Hold] methocarbamol (ROBAXIN)  IV       LOS: 1 day     Darlin Drop, MD [facility] Pager [phone]  If 7PM-7AM, please contact night-coverage [email or web address] Password [email or web address] 09/28/2017, 3:20 PM

## 2017-09-28 NOTE — Op Note (Signed)
09/27/2017 - 09/28/2017  2:20 PM  PATIENT:  Todd Coleman    PRE-OPERATIVE DIAGNOSIS:  left femur fracture  POST-OPERATIVE DIAGNOSIS:  Same  PROCEDURE:  OPEN REDUCTION INTERNAL FIXATION (ORIF) DISTAL FEMUR FRACTURE  SURGEON:  Youssouf Shipley, Jewel BaizeIMOTHY D, MD  ASSISTANT: Aquilla HackerHenry Martensen, PA-C, he was present and scrubbed throughout the case, critical for completion in a timely fashion, and for retraction, instrumentation, and closure.   ANESTHESIA:   Gen  PREOPERATIVE INDICATIONS:  Todd Coleman is a  77 y.o. male with a diagnosis of left femur fracture who failed conservative measures and elected for surgical management.    The risks benefits and alternatives were discussed with the patient preoperatively including but not limited to the risks of infection, bleeding, nerve injury, cardiopulmonary complications, the need for revision surgery, among others, and the patient was willing to proceed.  OPERATIVE IMPLANTS: [employer] [place] nail 10.5 x 220  OPERATIVE FINDINGS: unstable distal femur fracture   BLOOD LOSS: min  COMPLICATIONS: none  TOURNIQUET TIME: none  OPERATIVE PROCEDURE:  Patient was identified in the preoperative holding area and site was marked by me He was transported to the operating theater and placed on the table in supine position taking care to pad all bony prominences. After a preincinduction time out anesthesia was induced. The left Lower extremity was prepped and draped in normal sterile fashion and a pre-incision timeout was performed. He received ancef for preoperative antibiotics.   I performed a closed reduction of the fracture.  I then made a medial parapatellar incision and dissected down to the peritenon. I continued that incision to the medial parapatellar approach and was careful to protect the articular cartilage meniscus and cruciates.  Next I inserted the guidepin under fluoroscopic visualization at the tip of [place] line and centered on the AP. I used the  entry reamer to ream an entryway into the canal this point.  Next I passed the ball tip wire across the fracture confirmed in appropriate reduction and sequentially reamed up to the appropriate size for the above nail. The nail ended above the subtrochanteric region.  I selected the nail and inserted it under fluoroscopic guidance.  I was happy with the reduction I then placed four distal interlock screws obtaining a good purchase with all of them.  I then locked the distal screws and using a perfect circles technique placed a anterior to posterior proximal interlock screw.  final fluoroscopic images showed appropriate reduction and alignment of hardware I then thoroughly irrigated all wounds and the skin was closed.  POST OPERATIVE PLAN: Nonweightbearing knee immobilizer full time. DVT px: SCD's and chemical px

## 2017-09-29 ENCOUNTER — Encounter (HOSPITAL_COMMUNITY): Payer: Self-pay | Admitting: Orthopedic Surgery

## 2017-09-29 LAB — COMPREHENSIVE METABOLIC PANEL
ALK PHOS: 45 U/L (ref 38–126)
ALT: 16 U/L — ABNORMAL LOW (ref 17–63)
ANION GAP: 20 — AB (ref 5–15)
AST: 48 U/L — ABNORMAL HIGH (ref 15–41)
Albumin: 2.9 g/dL — ABNORMAL LOW (ref 3.5–5.0)
BILIRUBIN TOTAL: 1 mg/dL (ref 0.3–1.2)
BUN: 44 mg/dL — AB (ref 6–20)
CO2: 23 mmol/L (ref 22–32)
Calcium: 9.3 mg/dL (ref 8.9–10.3)
Chloride: 92 mmol/L — ABNORMAL LOW (ref 101–111)
Creatinine, Ser: 9.69 mg/dL — ABNORMAL HIGH (ref 0.61–1.24)
GFR calc Af Amer: 5 mL/min — ABNORMAL LOW (ref >60)
GFR, EST NON AFRICAN AMERICAN: 5 mL/min — AB (ref >60)
GLUCOSE: 146 mg/dL — AB (ref 65–99)
POTASSIUM: 4.8 mmol/L (ref 3.5–5.1)
Sodium: 135 mmol/L (ref 135–145)
Total Protein: 6.4 g/dL — ABNORMAL LOW (ref 6.5–8.1)

## 2017-09-29 LAB — GLUCOSE, CAPILLARY
GLUCOSE-CAPILLARY: 123 mg/dL — AB (ref 65–99)
GLUCOSE-CAPILLARY: 111 mg/dL — AB (ref 65–99)
GLUCOSE-CAPILLARY: 185 mg/dL — AB (ref 65–99)
Glucose-Capillary: 148 mg/dL — ABNORMAL HIGH (ref 65–99)
Glucose-Capillary: 168 mg/dL — ABNORMAL HIGH (ref 65–99)
Glucose-Capillary: 182 mg/dL — ABNORMAL HIGH (ref 65–99)

## 2017-09-29 LAB — CBC
HEMATOCRIT: 28.6 % — AB (ref 39.0–52.0)
Hemoglobin: 9.4 g/dL — ABNORMAL LOW (ref 13.0–17.0)
MCH: 33.8 pg (ref 26.0–34.0)
MCHC: 32.9 g/dL (ref 30.0–36.0)
MCV: 102.9 fL — AB (ref 78.0–100.0)
Platelets: 193 10*3/uL (ref 150–400)
RBC: 2.78 MIL/uL — ABNORMAL LOW (ref 4.22–5.81)
RDW: 14.9 % (ref 11.5–15.5)
WBC: 8.7 10*3/uL (ref 4.0–10.5)

## 2017-09-29 MED ORDER — RENA-VITE PO TABS
1.0000 | ORAL_TABLET | Freq: Every day | ORAL | Status: DC
  Administered 2017-09-29 – 2017-10-03 (×5): 1 via ORAL
  Filled 2017-09-29 (×5): qty 1

## 2017-09-29 MED ORDER — HEPARIN SODIUM (PORCINE) 5000 UNIT/ML IJ SOLN
5000.0000 [IU] | Freq: Three times a day (TID) | INTRAMUSCULAR | Status: DC
  Administered 2017-09-29 – 2017-10-04 (×13): 5000 [IU] via SUBCUTANEOUS
  Filled 2017-09-29 (×13): qty 1

## 2017-09-29 MED ORDER — SODIUM CHLORIDE 0.9 % IV SOLN: 100.0000 mL | INTRAVENOUS | Status: DC | PRN

## 2017-09-29 MED ORDER — SEVELAMER CARBONATE 800 MG PO TABS
2400.0000 mg | ORAL_TABLET | Freq: Three times a day (TID) | ORAL | Status: DC
  Administered 2017-09-29 – 2017-10-04 (×13): 2400 mg via ORAL
  Filled 2017-09-29 (×14): qty 3

## 2017-09-29 MED ORDER — PENTAFLUOROPROP-TETRAFLUOROETH EX AERO: 1.0000 "application " | INHALATION_SPRAY | CUTANEOUS | Status: DC | PRN

## 2017-09-29 MED ORDER — LIDOCAINE-PRILOCAINE 2.5-2.5 % EX CREA
1.0000 "application " | TOPICAL_CREAM | CUTANEOUS | Status: DC | PRN
  Filled 2017-09-29: qty 5

## 2017-09-29 MED ORDER — NEPRO/CARBSTEADY PO LIQD
237.0000 mL | Freq: Two times a day (BID) | ORAL | Status: DC
  Filled 2017-09-29 (×13): qty 237

## 2017-09-29 MED ORDER — LIDOCAINE HCL (PF) 1 % IJ SOLN: 5.0000 mL | INTRAMUSCULAR | Status: DC | PRN

## 2017-09-29 MED ORDER — DOXERCALCIFEROL 4 MCG/2ML IV SOLN
3.0000 ug | INTRAVENOUS | Status: DC
  Administered 2017-10-04: 3 ug via INTRAVENOUS
  Filled 2017-09-29 (×2): qty 2

## 2017-09-29 MED ORDER — MUPIROCIN 2 % EX OINT
1.0000 "application " | TOPICAL_OINTMENT | Freq: Two times a day (BID) | CUTANEOUS | Status: AC
  Administered 2017-09-29 – 2017-10-03 (×10): 1 via NASAL
  Filled 2017-09-29 (×3): qty 22

## 2017-09-29 MED ORDER — ALTEPLASE 2 MG IJ SOLR
2.0000 mg | Freq: Once | INTRAMUSCULAR | Status: DC | PRN
  Filled 2017-09-29: qty 2

## 2017-09-29 MED ORDER — HEPARIN SODIUM (PORCINE) 1000 UNIT/ML DIALYSIS
1000.0000 [IU] | INTRAMUSCULAR | Status: DC | PRN
  Filled 2017-09-29: qty 1

## 2017-09-29 MED ORDER — CHLORHEXIDINE GLUCONATE CLOTH 2 % EX PADS
6.0000 | MEDICATED_PAD | Freq: Every day | CUTANEOUS | Status: AC
  Administered 2017-09-29 – 2017-10-03 (×5): 6 via TOPICAL

## 2017-09-29 MED ORDER — NEPRO/CARBSTEADY PO LIQD: 237.0000 mL | Freq: Three times a day (TID) | ORAL | Status: DC

## 2017-09-29 NOTE — Discharge Instructions (Signed)
Do not bear weight with left leg. Keep dressings on and dry until follow up in the office with Dr. Wandra Feinstein. Murphy in 1-2 weeks. Elevate leg to reduce pain / swelling. You may need to increase pain medication up to 2 tablets every 4 hours for the first few days postoperatively.

## 2017-09-29 NOTE — Progress Notes (Signed)
PT Cancellation Note  Patient Details Name: Todd RobertsonDaniel Coleman MRN: [id number] DOB: 03/17/1941   Cancelled Treatment:    Reason Eval/Treat Not Completed: Patient at procedure or test/unavailable Attempted to evaluate PT at 1530, pt at HD will re-attempt tomorrow 4/11.  Etta GrandchildSean Bayan Kushnir, PT, DPT Acute Rehab Services Pager: [phone]      Etta GrandchildSean  Rosalva Neary 09/29/2017, 3:42 PM

## 2017-09-29 NOTE — Consult Note (Addendum)
[name] Renal Consultation Note    Indication for Consultation:  Management of ESRD/hemodialysis; anemia, hypertension/volume and secondary hyperparathyroidism  ZOX:[id number], Todd Muse, PA-C  HPI: Todd Coleman is a 77 y.o. male. 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  Past medical history significant for 9 year history DMT2 on insulin, CAD, HTN, HLD, Barrett's esophagus and s/p bilateral BKA.  Patient has been admitted for management of L femoral fracture. He fell out of his chair  Seen and examined at bedside.  Patient states he fell out of his wheelchair after accidentally hitting a curb when he went out in the rain, hitting his face and left knee.  Pain in left leg was immediate, denies LOC.  On presentation to the ED XR showed acute distal femur fracture.  Patient is s/p left ORIF on 4/9 by Dr. Eulah Pont.  We have been consulted for management of hemodialysis.  Reports pain with muscle spasms, but otherwise well controlled. Denies SOB, dyspnea, orthopnea, CP, n/v/d, abdominal pain and stump edema.   Patient dialyzes at [facility] and completed his last full HD on 4/8 per patient. He has been on HD for about 5-6 years.  Denies any complications with HD or cannulation of his LU AVF.  Contacting patient OP unit for dialysis orders. He thinks he may be going home tomorrow  Past Medical History:  Diagnosis Date  . CAD (coronary artery disease)    RCA chronic total occlusion, EF normal  . Diabetes mellitus   . Dialysis patient (HCC)   . Hypertension   . Renal failure    Past Surgical History:  Procedure Laterality Date  . BELOW KNEE LEG AMPUTATION Bilateral   . CARDIAC CATHETERIZATION  09/01/2017   At [facility], mild LAD dz, CFX (small) 60% provides collaterals to PDA, RCA 100% (chronic), RI 55%, EF 60%  . TONSILLECTOMY     Family History  Problem Relation Age of Onset  . Alzheimer's disease Mother 27  . Diabetes Mother   . Unexplained death Father 68  .  Diabetes Sister   . Diabetes Brother    Social History:  reports that he has quit smoking. He has never used smokeless tobacco. He reports that he does not drink alcohol or use drugs. Allergies  Allergen Reactions  . Dilaudid [Hydromorphone Hcl] Other (See Comments)    "makes me crazy".  . Penicillins Rash  . Ramipril Rash   Prior to Admission medications   Medication Sig Start Date End Date Taking? Authorizing Provider  Ascorbic Acid (VITAMIN C) 100 MG tablet Take 100 mg by mouth every evening.     Yes [provider]  aspirin 81 MG EC tablet Take 81 mg by mouth every evening.     Yes [provider]  atorvastatin (LIPITOR) 80 MG tablet Take 80 mg by mouth every evening.     Yes [provider]  calcitRIOL (ROCALTROL) 0.25 MCG capsule Take 0.25-0.5 mcg by mouth See admin instructions. Take 2 capsules (0.6mcg) by mouth Mon thru Sat and take 1 capsule (0.82mcg) on Sun.   Yes [provider]  clopidogrel (PLAVIX) 75 MG tablet Take 75 mg by mouth every evening.     Yes [provider]  fenofibrate (TRICOR) 145 MG tablet Take 145 mg by mouth daily. 08/11/17  Yes [provider]  insulin glargine (LANTUS SOLOSTAR) 100 UNIT/ML injection Inject 20 Units into the skin at bedtime.     Yes [provider]  metoprolol tartrate (LOPRESSOR)  25 MG tablet Take 25 mg by mouth daily. 09/02/17  Yes [provider]  multivitamin (RENA-VIT) TABS tablet Take 1 tablet by mouth daily.   Yes [provider]  omeprazole (PRILOSEC) 40 MG capsule Take 40 mg by mouth daily. 08/10/17  Yes [provider]  vitamin E 400 UNIT capsule Take 400 Units by mouth every evening.     Yes [provider]  acetaminophen (TYLENOL) 500 MG tablet Take 2 tablets (1,000 mg total) by mouth every 8 (eight) hours for 14 days. For Pain. 09/28/17 10/12/17  Albina BilletMartensen, Henry Calvin III, PA-C  aspirin EC 325 MG tablet Take 1 tablet (325 mg total) by  mouth daily for 14 days. For 14 days post op for DVT Prophylaxis.  Resume 81 mg Aspirin when completed. 09/28/17 10/12/17  Albina BilletMartensen, Henry Calvin III, PA-C  docusate sodium (COLACE) 100 MG capsule Take 1 capsule (100 mg total) by mouth 2 (two) times daily. To prevent constipation while taking pain medication. 09/28/17   Albina BilletMartensen, Henry Calvin III, PA-C  doxazosin (CARDURA) 2 MG tablet Take 2 mg by mouth daily.      [provider]  labetalol (NORMODYNE) 200 MG tablet Take 200 mg by mouth 2 (two) times daily.     [provider]  oxyCODONE (ROXICODONE) 5 MG immediate release tablet Take 1 tablet (5 mg total) by mouth every 4 (four) hours as needed for breakthrough pain. 09/28/17 10/28/17  Albina BilletMartensen, Henry Calvin III, PA-C   Current Facility-Administered Medications  Medication Dose Route Frequency Provider Last Rate Last Dose  . acetaminophen (TYLENOL) tablet 1,000 mg  1,000 mg Oral Q8H Martensen, Lucretia KernHenry Calvin III, PA-C   1,000 mg at 09/29/17 1002  . acetaminophen (TYLENOL) tablet 325-650 mg  325-650 mg Oral Q6H PRN Albina BilletMartensen, Henry Calvin III, PA-C      . aspirin tablet 325 mg  325 mg Oral Daily Albina BilletMartensen, Henry Calvin III, PA-C   325 mg at 09/29/17 1002  . atorvastatin (LIPITOR) tablet 80 mg  80 mg Oral QPM Albina BilletMartensen, Henry Calvin III, PA-C   80 mg at 09/28/17 1641  . ceFAZolin (ANCEF) IVPB 2g/100 mL premix  2 g Intravenous Q6H Martensen, Lucretia KernHenry Calvin III, PA-C      . Chlorhexidine Gluconate Cloth 2 % PADS 6 each  6 each Topical Q0600 Darlin DropHall, Carole N, DO   6 each at 09/29/17 [id number]  . clopidogrel (PLAVIX) tablet 75 mg  75 mg Oral QPM Albina BilletMartensen, Henry Calvin III, PA-C   75 mg at 09/29/17 1003  . docusate sodium (COLACE) capsule 100 mg  100 mg Oral BID Albina BilletMartensen, Henry Calvin III, PA-C   100 mg at 09/29/17 1003  . doxercalciferol (HECTOROL) injection 3 mcg  3 mcg Intravenous Q M,W,F-HD Penninger, Lillia AbedLindsay, PA      . fentaNYL (SUBLIMAZE) injection 50 mcg  50 mcg Intravenous Q2H PRN Albina BilletMartensen,  Henry Calvin III, PA-C   50 mcg at 09/28/17 2204  . insulin aspart (novoLOG) injection 0-9 Units  0-9 Units Subcutaneous Q4H Albina BilletMartensen, Henry Calvin III, PA-C   2 Units at 09/29/17 1005  . insulin glargine (LANTUS) injection 10 Units  10 Units Subcutaneous QHS Albina BilletMartensen, Henry Calvin III, PA-C   10 Units at 09/28/17 2204  . methocarbamol (ROBAXIN) tablet 500 mg  500 mg Oral Q6H PRN Albina BilletMartensen, Henry Calvin III, PA-C   500 mg at 09/29/17 1004   Or  . methocarbamol (ROBAXIN) 500 mg in dextrose 5 % 50 mL IVPB  500 mg Intravenous Q6H  PRN Albina Billet III, PA-C      . metoCLOPramide (REGLAN) injection 5 mg  5 mg Intravenous Q6H PRN Albina Billet III, PA-C   5 mg at 09/28/17 1648  . morphine 2 MG/ML injection 1 mg  1 mg Intravenous Q2H PRN Albina Billet III, PA-C      . mupirocin ointment (BACTROBAN) 2 % 1 application  1 application Nasal BID Darlin Drop, DO   1 application at 09/29/17 1003  . oxyCODONE (Oxy IR/ROXICODONE) immediate release tablet 5-10 mg  5-10 mg Oral Q4H PRN Albina Billet III, PA-C   10 mg at 09/29/17 1004   Labs: Basic Metabolic Panel: Recent Labs  Lab 09/27/17 2123 09/29/17 0655  NA 133* 135  K 4.4 4.8  CL 91* 92*  CO2 25 23  GLUCOSE 243* 146*  BUN 26* 44*  CREATININE 6.28* 9.69*  CALCIUM 9.2 9.3   Liver Function Tests: Recent Labs  Lab 09/27/17 2123 09/29/17 0655  AST 45* 48*  ALT 20 16*  ALKPHOS 53 45  BILITOT 1.0 1.0  PROT 7.3 6.4*  ALBUMIN 3.5 2.9*   CBC: Recent Labs  Lab 09/27/17 2123 09/29/17 0655  WBC 8.9 8.7  NEUTROABS 7.4  --   HGB 12.0* 9.4*  HCT 36.4* 28.6*  MCV 101.1* 102.9*  PLT 230 193   CBG: Recent Labs  Lab 09/28/17 1609 09/28/17 2026 09/29/17 0012 09/29/17 0500 09/29/17 0931  GLUCAP 189* 158* 148* 123* 168*   Studies/Results: Dg Knee 1-2 Views Left  Result Date: 09/27/2017 CLINICAL DATA:  LEFT hip and knee pain after fall from wheelchair. History of LEFT hip surgery. EXAM: LEFT  KNEE - 1-2 VIEW COMPARISON:  None available for comparison at time of study interpretation. FINDINGS: Limited assessment due to nonstandard views, per technologist patient was in pain and had difficulty maintaining position for imaging. Status post LEFT below-knee amputation. Acute comminuted impacted femoral metadiaphysis fracture. No definite intra-articular extension. Osteopenia. No destructive bony lesions. Severe vascular calcification and stent and popliteal fossa. IMPRESSION: Acute distal femur fracture.  No dislocation. Status post below-knee amputation. Electronically Signed   By: Awilda Metro M.D.   On: 09/27/2017 20:48   Ct Head Wo Contrast  Result Date: 09/27/2017 CLINICAL DATA:  Fall from wheelchair. EXAM: CT HEAD WITHOUT CONTRAST CT CERVICAL SPINE WITHOUT CONTRAST TECHNIQUE: Multidetector CT imaging of the head and cervical spine was performed following the standard protocol without intravenous contrast. Multiplanar CT image reconstructions of the cervical spine were also generated. COMPARISON:  08/03/2016 FINDINGS: CT HEAD FINDINGS Brain: There is diffuse low attenuation throughout the subcortical and periventricular white matter compatible with chronic small vessel ischemic disease. Prominence of the sulci and ventricles are noted compatible with brain atrophy. No evidence for acute infarction, hemorrhage, hydrocephalus, extra-axial collection or mass lesion/mass effect. Vascular: No hyperdense vessel or unexpected calcification. Skull: Normal. Negative for fracture or focal lesion. Sinuses/Orbits: There is mild mucosal thickening involving the right maxillary sinus. Partial opacification of the left mastoid air cells. Other: None CT CERVICAL SPINE FINDINGS Alignment: Normal. Skull base and vertebrae: No acute fracture. No primary bone lesion or focal pathologic process. Soft tissues and spinal canal: No prevertebral fluid or swelling. No visible canal hematoma. Disc levels: Disc space  narrowing and ventral spurring noted at C5-6 and C6-7. Upper chest: Negative. Other: None IMPRESSION: 1. No acute intracranial abnormalities. Chronic small vessel ischemic disease and brain atrophy noted. 2. No evidence for cervical spine fracture or dislocation. 3. Degenerative  disc disease noted within the cervical spine. Electronically Signed   By: Signa Kell M.D.   On: 09/27/2017 20:50   Ct Cervical Spine Wo Contrast  Result Date: 09/27/2017 CLINICAL DATA:  Fall from wheelchair. EXAM: CT HEAD WITHOUT CONTRAST CT CERVICAL SPINE WITHOUT CONTRAST TECHNIQUE: Multidetector CT imaging of the head and cervical spine was performed following the standard protocol without intravenous contrast. Multiplanar CT image reconstructions of the cervical spine were also generated. COMPARISON:  08/03/2016 FINDINGS: CT HEAD FINDINGS Brain: There is diffuse low attenuation throughout the subcortical and periventricular white matter compatible with chronic small vessel ischemic disease. Prominence of the sulci and ventricles are noted compatible with brain atrophy. No evidence for acute infarction, hemorrhage, hydrocephalus, extra-axial collection or mass lesion/mass effect. Vascular: No hyperdense vessel or unexpected calcification. Skull: Normal. Negative for fracture or focal lesion. Sinuses/Orbits: There is mild mucosal thickening involving the right maxillary sinus. Partial opacification of the left mastoid air cells. Other: None CT CERVICAL SPINE FINDINGS Alignment: Normal. Skull base and vertebrae: No acute fracture. No primary bone lesion or focal pathologic process. Soft tissues and spinal canal: No prevertebral fluid or swelling. No visible canal hematoma. Disc levels: Disc space narrowing and ventral spurring noted at C5-6 and C6-7. Upper chest: Negative. Other: None IMPRESSION: 1. No acute intracranial abnormalities. Chronic small vessel ischemic disease and brain atrophy noted. 2. No evidence for cervical spine  fracture or dislocation. 3. 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 Electronically Signed   By: Signa Kell M.D.   On: 09/27/2017 20:50   Dg Chest Port 1 View  Result Date: 09/27/2017 CLINICAL DATA:  Fall out of wheelchair today. EXAM: PORTABLE CHEST 1 VIEW COMPARISON:  Radiograph 08/30/2017 FINDINGS: Low lung volumes. Similar in size and mediastinal contours to prior exam allowing for differences in technique. Improved pulmonary edema and vascular congestion. Streaky left lung base atelectasis or scarring. No focal airspace disease. No pneumothorax or large pleural effusion. Vascular stent in the left axilla again seen. No evidence of acute osseous abnormality. IMPRESSION: Low lung volumes without acute abnormality or evidence of acute traumatic injury. Electronically Signed   By: Rubye Oaks M.D.   On: 09/27/2017 21:38   Dg C-arm 1-60 Min  Result Date: 09/28/2017 CLINICAL DATA:  77 year old male with distal left femur fracture. Subsequent encounter. EXAM: DG C-ARM 61-120 MIN; LEFT FEMUR 2 VIEWS Fluoroscopic time: 2 minutes and 24 seconds COMPARISON:  09/27/2017 plain film exam. FINDINGS: Four intraoperative C-arm views submitted for review after procedure. Remote left hip pinning. Left femoral intramedullary rod placed across the distal left femur fracture with proximal and distal fixation screws. Fracture fragments slightly separated and angulated. Prominent vascular calcifications. IMPRESSION: Open reduction and internal fixation of distal left femur fracture as detailed above. Electronically Signed   By: Lacy Duverney M.D.   On: 09/28/2017 14:40   Dg Hip Unilat W Or Wo Pelvis 2-3 Views Left  Result Date: 09/27/2017 CLINICAL DATA:  Left hip and knee pain after fall from wheelchair today. EXAM: DG HIP (WITH OR WITHOUT PELVIS) 2-3V LEFT COMPARISON:  06/13/2016 intraoperative radiograph of the left hip. FINDINGS: Four cannulated screws are noted along the long axis of the left  femoral neck traversing a chronic subcapital impacted left femoral neck fracture. No change in configuration. No evidence of acute fracture, hardware failure or joint dislocation. Intact bony pelvis. Lower lumbar facet arthropathy at L5-S1. Aorto-bi-iliofemoral atherosclerosis with vascular surgical clips projecting over the groin bilaterally. IMPRESSION: Negative for acute fracture  or malalignment. Intact left femoral neck fixation. Electronically Signed   By: Tollie Eth M.D.   On: 09/27/2017 20:19   Dg Femur Min 2 Views Left  Result Date: 09/28/2017 CLINICAL DATA:  77 year old male with distal left femur fracture. Subsequent encounter. EXAM: DG C-ARM 61-120 MIN; LEFT FEMUR 2 VIEWS Fluoroscopic time: 2 minutes and 24 seconds COMPARISON:  09/27/2017 plain film exam. FINDINGS: Four intraoperative C-arm views submitted for review after procedure. Remote left hip pinning. Left femoral intramedullary rod placed across the distal left femur fracture with proximal and distal fixation screws. Fracture fragments slightly separated and angulated. Prominent vascular calcifications. IMPRESSION: Open reduction and internal fixation of distal left femur fracture as detailed above. Electronically Signed   By: Lacy Duverney M.D.   On: 09/28/2017 14:40    ROS: All others negative except those listed in HPI.  Physical Exam: Vitals:   09/28/17 2000 09/28/17 2201 09/29/17 0503 09/29/17 1100  BP: (!) 83/57 118/61 95/62 (!) 81/37  Pulse: (!) 104 (!) 107 (!) 105 (!) 112  Resp:   20 20  Temp: 98 F (36.7 C)  98.9 F (37.2 C) 98.9 F (37.2 C)  TempSrc: Oral  Oral Oral  SpO2: 93%  93% 99%  Weight:      Height:         General:  NAD, chronically ill appearing male, sitting in bed.  Head: NCAT, sclera not icteric, MMM, abrasions on nose and lip Neck: Supple. No lymphadenopathy. No JVD.  Lungs: CTA bilaterally. No wheeze, rales or rhonchi. Breathing is unlabored on 2L via . Heart: RRR. +2/6 systolic murmur. No  rubs or gallops.  Abdomen: soft, nontender, +BS, no guarding, no rebound tenderness Lower extremities: b/l BKA - LLE dressed. No edema on R.  Neuro: A&Ox3. Moves all extremities spontaneously. Psych:  Responds to questions appropriately with a normal affect. Dialysis Access: LU AVF +b/t  Dialysis Orders:  MWF - [facility]  4hrs, BFR 400, DFR 800,  EDW 82.5kg, 2K/ 2.5Ca, Linear Na   Access: LU AVF  Heparin 5000 Unit bolus Hectorol qHD  Assessment/Plan: 1.  L distal femur fracture - b/l BKA, s/p ORIF on 4/9 Dr. Eulah Pont. Pain well controlled. Per primary.  Once is cleared from PT will be going home 2.  ESRD -  Continue HD per regular MWF schedule via AVF. Orders written for today.   3.  Hypertension/volume  - BP variable, mostly soft.  Per OP dialysis med list not on HTN meds. Appears euvolemic on exam. Titrate down volume as tolerated.  4.  Anemia  - Hgb 12.0>9.4 post surgery.  Follow trend. No ESA as OP, no IV iron due to allergy.  5.  Secondary Hyperparathyroidism -  CCa 10.2. No P. Hold VDRA today. Changed to 2K/2Ca bath d/t borderline high Ca. 6.  Nutrition - Alb 2.9. Renal diet with fluid restrictions. Nepro.  7. DMT2 - on insulin, per primary 8. CAD - cardio consulted for pre op clearance. Per primary- they have signed off   Virgina Norfolk, PA-C [facility] Pager: [phone] 09/29/2017, 11:25 AM   Patient seen and examined, agree with above note with above modifications. Chronically ill WM with ESRD- HD at the [place] in [place]- bilat amputee- feel out of wheelchair and suffered a femoral fx- s/p operative repair.  We will continue with his routine HD and appropriate titration of HD related meds  Annie Sable, MD 09/29/2017

## 2017-09-29 NOTE — Progress Notes (Signed)
Orthopedic Tech Progress Note Patient Details:  Luberta RobertsonDaniel Couts Mar 02, 1941 [id number]  Patient ID: Luberta Robertsonaniel Groot, male   DOB: Mar 02, 1941, 77 y.o.   MRN: [id number] Pt cant have ohf due to age resrtictions  Trinna PostMartinez, Yevonne Yokum J 09/29/2017, 7:12 PM

## 2017-09-29 NOTE — Care Management Note (Signed)
Case Management Note  Patient Details  Name: Todd RobertsonDaniel Coleman MRN: [id number] Date of Birth: 1941/02/22  Subjective/Objective:  ORIF Left Femur, bilateral amputee                  Action/Plan: NCM spoke to pt's sister, Arline AspCindy. [place] He has electric w/c, w/c, RW, tub bench and prosthesis at home. [facility]. Message left for attending for HHPT, OT, aide and SW with F2F orders. [facility] care and added disciplines. Pt declines SNF and prefers to go home. His brother lives in apt with him but is not capable of assisting.   Expected Discharge Date:                  Expected Discharge Plan:  Home w Home Health Services  In-House Referral:  Clinical Social Work  Discharge planning Services  CM Consult  Post Acute Care Choice:  Home Health Choice offered to:  Patient  DME Arranged:  N/A DME Agency:  NA  HH Arranged:  PT, OT, Nurse's Aide, Social Work, Refused SNF HH Agency:  [facility]  Status of Service:  In process, will continue to follow  If discussed at Long Length of Stay Meetings, dates discussed:    Additional Comments:  Elliot CousinShavis, [name], RN 09/29/2017, 5:30 PM

## 2017-09-29 NOTE — Progress Notes (Signed)
   We will sign off.  Please let us know if we can help further.

## 2017-09-29 NOTE — Progress Notes (Signed)
Got in contact with hospilatist and she stated that she will get in contact with Nephrology and get orders placed.

## 2017-09-29 NOTE — Progress Notes (Signed)
Occupational Therapy Evaluation Patient Details Name: Todd RobertsonDaniel Coleman MRN: [id number] DOB: 01/31/1941 Today's Date: 09/29/2017    History of Present Illness Todd RobertsonDaniel Coleman is a 77 y.o. male with medical history significant of CAD, ESRD on HD MWF, HTN, HLD, DM2, Barrett's esophagus. Pt with Bilateral amputations.  (below the knee). Pt fell out of WC this week.  Pt with broken L femur.  Pt had ORIF 4/9    Clinical Impression   Pt admitted with  L femur fx. Pt currently with functional limitations due to the deficits listed below (see OT Problem List).  Pt will benefit from skilled OT to increase their safety and independence with ADL and functional mobility for ADL to facilitate discharge to venue listed below.   At baseline pts needs slideboard transfers with brother to A ( who is also an amputee).  Pt lives at [facility].  Pt has power WC.  Pt wants to go home with brother and have HH but will need to further assess mobility.       Follow Up Recommendations  Home health OT;SNF;Supervision/Assistance - 24 hour    Equipment Recommendations  None recommended by OT       Precautions / Restrictions Restrictions Weight Bearing Restrictions: Yes LLE Weight Bearing: Non weight bearing      Mobility Bed Mobility Overal bed mobility: Needs Assistance Bed Mobility: Supine to Sit     Supine to sit: Max assist     General bed mobility comments: HOB raised. Using bed rail  Transfers              NT        Balance Overall balance assessment: Mild deficits observed, not formally tested(in sitting)                                         ADL either performed or assessed with clinical judgement   ADL Overall ADL's : Needs assistance/impaired     Grooming: Minimal assistance;Sitting;Cueing for safety;Cueing for sequencing                                 General ADL Comments: Pt did sit EOB with OT.  Pain limiting pt this OT session.       Vision  Patient Visual Report: No change from baseline              Pertinent Vitals/Pain Pain Score: 8  Pain Location: L femur area- spasms Pain Descriptors / Indicators: Shooting;Spasm Pain Intervention(s): Limited activity within patient's tolerance;Monitored during session;Repositioned     Hand Dominance     Extremity/Trunk Assessment Upper Extremity Assessment Upper Extremity Assessment: Generalized weakness           Communication Communication Communication: No difficulties   Cognition Arousal/Alertness: Awake/alert Behavior During Therapy: WFL for tasks assessed/performed Overall Cognitive Status: Within Functional Limits for tasks assessed                                                Home Living Family/patient expects to be discharged to:: Private residence Living Arrangements: Other relatives Available Help at Discharge: Family Type of Home: House       Home Layout: One level  Bathroom Shower/Tub: Chief Strategy Officer: Standard     Home Equipment: Wheelchair - power;Tub bench;Bedside commode;Wheelchair - manual;Hospital bed          Prior Functioning/Environment Level of Independence: Independent with assistive device(s)                 OT Problem List: Decreased strength;Decreased activity tolerance;Decreased knowledge of precautions;Decreased safety awareness;Impaired balance (sitting and/or standing)      OT Treatment/Interventions: Self-care/ADL training;Patient/family education;DME and/or AE instruction;Therapeutic activities    OT Goals(Current goals can be found in the care plan section) Acute Rehab OT Goals Patient Stated Goal: get home with brother OT Goal Formulation: With patient Time For Goal Achievement: 10/13/17 Potential to Achieve Goals: Good ADL Goals Pt Will Perform Lower Body Bathing: with min assist;sitting/lateral leans Pt Will Perform Lower Body Dressing: with min  assist;sitting/lateral leans Pt Will Transfer to Toilet: with min assist;bedside commode;with transfer board Pt Will Perform Toileting - Clothing Manipulation and hygiene: with min assist;sitting/lateral leans  OT Frequency: Min 2X/week    AM-PAC PT "6 Clicks" Daily Activity     Outcome Measure Help from another person eating meals?: None Help from another person taking care of personal grooming?: A Little Help from another person toileting, which includes using toliet, bedpan, or urinal?: Total Help from another person bathing (including washing, rinsing, drying)?: A Lot Help from another person to put on and taking off regular upper body clothing?: A Little Help from another person to put on and taking off regular lower body clothing?: Total 6 Click Score: 14   End of Session Nurse Communication: Mobility status  Activity Tolerance: Patient limited by pain Patient left: in bed  OT Visit Diagnosis: History of falling (Z91.81);Pain Pain - Right/Left: Left Pain - part of body: Leg                Time: 1610-9604 OT Time Calculation (min): 32 min Charges:  OT General Charges $OT Visit: 1 Visit OT Evaluation $OT Eval Moderate Complexity: 1 Mod OT Treatments $Self Care/Home Management : 8-22 mins G-Codes:     Lise Auer, OT [phone]  Einar Crow D 09/29/2017, 2:21 PM

## 2017-09-29 NOTE — Progress Notes (Signed)
RN called Ortho PA about pt getting dialysis MWF and not having any orders or nephrologist on care team. Paged Hospilatist awaiting call back.

## 2017-09-29 NOTE — Procedures (Signed)
Patient was seen on dialysis and the procedure was supervised.  BFR 400  Via AVF BP is  85/46.   Patient appears to be tolerating treatment well- is alert even with that BP   Todd Coleman A 09/29/2017

## 2017-09-29 NOTE — Progress Notes (Signed)
[facility]                                                                              Patient Demographics  Todd Coleman, is a 77 y.o. male, DOB - Feb 10, 1941, WUJ:[id number]  Admit date - 09/27/2017   Admitting Physician Therisa Doyne, MD  Outpatient Primary MD for the patient is Romie Jumper, PA-C  Outpatient specialists:   LOS - 2  days   Medical records reviewed and are as summarized below:    Chief Complaint  Patient presents with  . Fall       Brief summary   Patient is a 77 year old male with diabetes mellitus on insulin, CAD, hypertension, hyperlipidemia status post bilateral BKA, ESRD on HD, MWF.  Patient fell out of his wheelchair after accidentally hitting a curb, hitting his face and knee and sustained a left femoral fracture.  Orthopedics was consulted, patient was admitted for further workup.   Assessment & Plan    Principal Problem:   Closed left femoral fracture (HCC) -Status post ORIF distal femur fracture, left on 4/9, postop day #1 - DVT prophylaxis, pain control per orthopedics   Active Problems:   ESRD (end stage renal disease) (HCC) -On hemodialysis MWF, nephrology consulted    HTN (hypertension) -Hypotensive postop, continue to hold antihypertensives    DM (diabetes mellitus), type 2 with renal complications (HCC) - Continue sliding scale insulin   CAD (coronary artery disease), PAD (peripheral artery disease) (HCC) - Currently no chest pai, continue Plavix, statin  -Cardiology was consulted for surgical clearance    Code Status: DNR DVT Prophylaxis: Heparin subcu Family Communication: Discussed in detail with the patient, all imaging results, lab results explained to the patient    Disposition Plan:  Time Spent in minutes 25 minutes  Procedures:  Hemodialysis ORIF left femur fracture  Consultants:   Orthopedics Nephrology  Antimicrobials:   None   Medications  Scheduled Meds: . acetaminophen   1,000 mg Oral Q8H  . aspirin  325 mg Oral Daily  . atorvastatin  80 mg Oral QPM  . Chlorhexidine Gluconate Cloth  6 each Topical Q0600  . clopidogrel  75 mg Oral QPM  . docusate sodium  100 mg Oral BID  . doxercalciferol  3 mcg Intravenous Q M,W,F-HD  . feeding supplement (NEPRO CARB STEADY)  237 mL Oral BID BM  . insulin aspart  0-9 Units Subcutaneous Q4H  . insulin glargine  10 Units Subcutaneous QHS  . multivitamin  1 tablet Oral QHS  . mupirocin ointment  1 application Nasal BID  . sevelamer carbonate  2,400 mg Oral TID WC   Continuous Infusions: . sodium chloride    . sodium chloride    . methocarbamol (ROBAXIN)  IV     PRN Meds:.sodium chloride, sodium chloride, acetaminophen, alteplase, fentaNYL (SUBLIMAZE) injection, heparin, lidocaine (PF), lidocaine-prilocaine, methocarbamol **OR** methocarbamol (ROBAXIN)  IV, metoCLOPramide (REGLAN) injection, morphine injection, oxyCODONE, pentafluoroprop-tetrafluoroeth   Antibiotics   Anti-infectives (From admission, onward)   Start     Dose/Rate Route Frequency Ordered Stop   09/29/17 0800  ceFAZolin (ANCEF) IVPB 2g/100 mL premix     2  g 200 mL/hr over 30 Minutes Intravenous Every 6 hours 09/28/17 1549 09/29/17 1551   09/28/17 0745  ceFAZolin (ANCEF) IVPB 2g/100 mL premix  Status:  Discontinued     2 g 200 mL/hr over 30 Minutes Intravenous On call to O.R. 09/28/17 0731 09/28/17 0747   09/28/17 0745  ceFAZolin (ANCEF) IVPB 2g/100 mL premix     2 g 200 mL/hr over 30 Minutes Intravenous On call to O.R. 09/28/17 0744 09/28/17 1320        Subjective:   Todd Robertsonaniel Coleman was seen and examined today.  Denies any specific complaints.  Pain currently controlled with medications.  Patient denies dizziness, chest pain, shortness of breath, abdominal pain, N/V/D/C. No acute events overnight.    Objective:   Vitals:   09/29/17 1400 09/29/17 1430 09/29/17 1500 09/29/17 1530  BP: (!) 85/46 (!) 75/50 (!) 90/50 (!) 104/50  Pulse: 100 (!)  108 (!) 110 99  Resp:      Temp:      TempSrc:      SpO2:      Weight:      Height:        Intake/Output Summary (Last 24 hours) at 09/29/2017 1606 Last data filed at 09/29/2017 0900 Gross per 24 hour  Intake 582 ml  Output -  Net 582 ml     Wt Readings from Last 3 Encounters:  09/27/17 87.1 kg (192 lb)  08/11/17 87.1 kg (192 lb)  08/01/16 86.2 kg (190 lb)     Exam  General: Alert and oriented x 3, NAD  Eyes  HEENT:  Atraumatic, normocephalic  Cardiovascular: S1 S2 auscultated Regular rate and rhythm.  Respiratory: Clear to auscultation bilaterally, no wheezing, rales or rhonchi  Gastrointestinal: Soft, nontender, nondistended, + bowel sounds  Ext: bilateral LE amputation  Neuro: no new deficits  Musculoskeletal: No digital cyanosis, clubbing  Skin: No rashes  Psych: Normal affect and demeanor, alert and oriented x3    Data Reviewed:  I have personally reviewed following labs and imaging studies  Micro Results Recent Results (from the past 240 hour(s))  Surgical pcr screen     Status: Abnormal   Collection Time: 09/28/17  5:54 AM  Result Value Ref Range Status   MRSA, PCR POSITIVE (A) NEGATIVE Final    Comment: RESULT CALLED TO, READ BACK BY AND VERIFIED WITH: M. Peppeh RN 9:40 09/28/17 ([email or web address])    Staphylococcus aureus POSITIVE (A) NEGATIVE Final    Comment: (NOTE) The Xpert SA Assay (FDA approved for NASAL specimens in patients 75 years of age and older), is one component of a comprehensive surveillance program. It is not intended to diagnose infection nor to guide or monitor treatment.     Radiology Reports Dg Knee 1-2 Views Left  Result Date: 09/27/2017 CLINICAL DATA:  LEFT hip and knee pain after fall from wheelchair. History of LEFT hip surgery. EXAM: LEFT KNEE - 1-2 VIEW COMPARISON:  None available for comparison at time of study interpretation. FINDINGS: Limited assessment due to nonstandard views, per technologist patient was in pain and  had difficulty maintaining position for imaging. Status post LEFT below-knee amputation. Acute comminuted impacted femoral metadiaphysis fracture. No definite intra-articular extension. Osteopenia. No destructive bony lesions. Severe vascular calcification and stent and popliteal fossa. IMPRESSION: Acute distal femur fracture.  No dislocation. Status post below-knee amputation. Electronically Signed   By: Awilda Metroourtnay  Bloomer M.D.   On: 09/27/2017 20:48   Ct Head Wo Contrast  Result Date: 09/27/2017 CLINICAL DATA:  Fall  from wheelchair. EXAM: CT HEAD WITHOUT CONTRAST CT CERVICAL SPINE WITHOUT CONTRAST TECHNIQUE: Multidetector CT imaging of the head and cervical spine was performed following the standard protocol without intravenous contrast. Multiplanar CT image reconstructions of the cervical spine were also generated. COMPARISON:  08/03/2016 FINDINGS: CT HEAD FINDINGS Brain: There is diffuse low attenuation throughout the subcortical and periventricular white matter compatible with chronic small vessel ischemic disease. Prominence of the sulci and ventricles are noted compatible with brain atrophy. No evidence for acute infarction, hemorrhage, hydrocephalus, extra-axial collection or mass lesion/mass effect. Vascular: No hyperdense vessel or unexpected calcification. Skull: Normal. Negative for fracture or focal lesion. Sinuses/Orbits: There is mild mucosal thickening involving the right maxillary sinus. Partial opacification of the left mastoid air cells. Other: None CT CERVICAL SPINE FINDINGS Alignment: Normal. Skull base and vertebrae: No acute fracture. No primary bone lesion or focal pathologic process. Soft tissues and spinal canal: No prevertebral fluid or swelling. No visible canal hematoma. Disc levels: Disc space narrowing and ventral spurring noted at C5-6 and C6-7. Upper chest: Negative. Other: None IMPRESSION: 1. No acute intracranial abnormalities. Chronic small vessel ischemic disease and brain  atrophy noted. 2. No evidence for cervical spine fracture or dislocation. 3. 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 Electronically Signed   By: Signa Kell M.D.   On: 09/27/2017 20:50   Ct Cervical Spine Wo Contrast  Result Date: 09/27/2017 CLINICAL DATA:  Fall from wheelchair. EXAM: CT HEAD WITHOUT CONTRAST CT CERVICAL SPINE WITHOUT CONTRAST TECHNIQUE: Multidetector CT imaging of the head and cervical spine was performed following the standard protocol without intravenous contrast. Multiplanar CT image reconstructions of the cervical spine were also generated. COMPARISON:  08/03/2016 FINDINGS: CT HEAD FINDINGS Brain: There is diffuse low attenuation throughout the subcortical and periventricular white matter compatible with chronic small vessel ischemic disease. Prominence of the sulci and ventricles are noted compatible with brain atrophy. No evidence for acute infarction, hemorrhage, hydrocephalus, extra-axial collection or mass lesion/mass effect. Vascular: No hyperdense vessel or unexpected calcification. Skull: Normal. Negative for fracture or focal lesion. Sinuses/Orbits: There is mild mucosal thickening involving the right maxillary sinus. Partial opacification of the left mastoid air cells. Other: None CT CERVICAL SPINE FINDINGS Alignment: Normal. Skull base and vertebrae: No acute fracture. No primary bone lesion or focal pathologic process. Soft tissues and spinal canal: No prevertebral fluid or swelling. No visible canal hematoma. Disc levels: Disc space narrowing and ventral spurring noted at C5-6 and C6-7. Upper chest: Negative. Other: None IMPRESSION: 1. No acute intracranial abnormalities. Chronic small vessel ischemic disease and brain atrophy noted. 2. No evidence for cervical spine fracture or dislocation. 3. 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 Electronically Signed   By: Signa Kell M.D.   On: 09/27/2017 20:50   Dg Chest Port 1 View  Result  Date: 09/27/2017 CLINICAL DATA:  Fall out of wheelchair today. EXAM: PORTABLE CHEST 1 VIEW COMPARISON:  Radiograph 08/30/2017 FINDINGS: Low lung volumes. Similar in size and mediastinal contours to prior exam allowing for differences in technique. Improved pulmonary edema and vascular congestion. Streaky left lung base atelectasis or scarring. No focal airspace disease. No pneumothorax or large pleural effusion. Vascular stent in the left axilla again seen. No evidence of acute osseous abnormality. IMPRESSION: Low lung volumes without acute abnormality or evidence of acute traumatic injury. Electronically Signed   By: Rubye Oaks M.D.   On: 09/27/2017 21:38   Dg C-arm 1-60 Min  Result Date: 09/28/2017 CLINICAL DATA:  77 year old male with distal left femur fracture. Subsequent encounter. EXAM: DG C-ARM 61-120 MIN; LEFT FEMUR 2 VIEWS Fluoroscopic time: 2 minutes and 24 seconds COMPARISON:  09/27/2017 plain film exam. FINDINGS: Four intraoperative C-arm views submitted for review after procedure. Remote left hip pinning. Left femoral intramedullary rod placed across the distal left femur fracture with proximal and distal fixation screws. Fracture fragments slightly separated and angulated. Prominent vascular calcifications. IMPRESSION: Open reduction and internal fixation of distal left femur fracture as detailed above. Electronically Signed   By: Lacy Duverney M.D.   On: 09/28/2017 14:40   Dg Hip Unilat W Or Wo Pelvis 2-3 Views Left  Result Date: 09/27/2017 CLINICAL DATA:  Left hip and knee pain after fall from wheelchair today. EXAM: DG HIP (WITH OR WITHOUT PELVIS) 2-3V LEFT COMPARISON:  06/13/2016 intraoperative radiograph of the left hip. FINDINGS: Four cannulated screws are noted along the long axis of the left femoral neck traversing a chronic subcapital impacted left femoral neck fracture. No change in configuration. No evidence of acute fracture, hardware failure or joint dislocation. Intact bony  pelvis. Lower lumbar facet arthropathy at L5-S1. Aorto-bi-iliofemoral atherosclerosis with vascular surgical clips projecting over the groin bilaterally. IMPRESSION: Negative for acute fracture or malalignment. Intact left femoral neck fixation. Electronically Signed   By: Tollie Eth M.D.   On: 09/27/2017 20:19   Dg Femur Min 2 Views Left  Result Date: 09/28/2017 CLINICAL DATA:  77 year old male with distal left femur fracture. Subsequent encounter. EXAM: DG C-ARM 61-120 MIN; LEFT FEMUR 2 VIEWS Fluoroscopic time: 2 minutes and 24 seconds COMPARISON:  09/27/2017 plain film exam. FINDINGS: Four intraoperative C-arm views submitted for review after procedure. Remote left hip pinning. Left femoral intramedullary rod placed across the distal left femur fracture with proximal and distal fixation screws. Fracture fragments slightly separated and angulated. Prominent vascular calcifications. IMPRESSION: Open reduction and internal fixation of distal left femur fracture as detailed above. Electronically Signed   By: Lacy Duverney M.D.   On: 09/28/2017 14:40    Lab Data:  CBC: Recent Labs  Lab 09/27/17 2123 09/29/17 0655  WBC 8.9 8.7  NEUTROABS 7.4  --   HGB 12.0* 9.4*  HCT 36.4* 28.6*  MCV 101.1* 102.9*  PLT 230 193   Basic Metabolic Panel: Recent Labs  Lab 09/27/17 2123 09/29/17 0655  NA 133* 135  K 4.4 4.8  CL 91* 92*  CO2 25 23  GLUCOSE 243* 146*  BUN 26* 44*  CREATININE 6.28* 9.69*  CALCIUM 9.2 9.3   GFR: Estimated Creatinine Clearance: 6.8 mL/min (A) (by C-G formula based on SCr of 9.69 mg/dL (H)). Liver Function Tests: Recent Labs  Lab 09/27/17 2123 09/29/17 0655  AST 45* 48*  ALT 20 16*  ALKPHOS 53 45  BILITOT 1.0 1.0  PROT 7.3 6.4*  ALBUMIN 3.5 2.9*   No results for input(s): LIPASE, AMYLASE in the last 168 hours. No results for input(s): AMMONIA in the last 168 hours. Coagulation Profile: No results for input(s): INR, PROTIME in the last 168 hours. Cardiac  Enzymes: No results for input(s): CKTOTAL, CKMB, CKMBINDEX, TROPONINI in the last 168 hours. BNP (last 3 results) No results for input(s): PROBNP in the last 8760 hours. HbA1C: Recent Labs    09/28/17 0528  HGBA1C 6.2*   CBG: Recent Labs  Lab 09/28/17 2026 09/29/17 0012 09/29/17 0500 09/29/17 0931 09/29/17 1109  GLUCAP 158* 148* 123* 168* 182*   Lipid Profile: No results for input(s): CHOL, HDL, LDLCALC, TRIG, CHOLHDL, LDLDIRECT  in the last 72 hours. Thyroid Function Tests: No results for input(s): TSH, T4TOTAL, FREET4, T3FREE, THYROIDAB in the last 72 hours. Anemia Panel: No results for input(s): VITAMINB12, FOLATE, FERRITIN, TIBC, IRON, RETICCTPCT in the last 72 hours. Urine analysis: No results found for: COLORURINE, APPEARANCEUR, LABSPEC, PHURINE, GLUCOSEU, HGBUR, BILIRUBINUR, KETONESUR, PROTEINUR, UROBILINOGEN, NITRITE, LEUKOCYTESUR   Aladdin Kollmann M.D. [facility] 09/29/2017, 4:06 PM  Pager: [phone] Between 7am to 7pm - call Pager - [phone]  After 7pm go to [email or web address] - password [email or web address]  Call night coverage person covering after 7pm

## 2017-09-29 NOTE — Anesthesia Postprocedure Evaluation (Signed)
Anesthesia Post Note  Patient: Todd RobertsonDaniel Rafuse  Procedure(s) Performed: OPEN REDUCTION INTERNAL FIXATION (ORIF) DISTAL FEMUR FRACTURE (Left )     Patient location during evaluation: PACU Anesthesia Type: General Level of consciousness: awake and alert Pain management: pain level controlled Vital Signs Assessment: post-procedure vital signs reviewed and stable Respiratory status: spontaneous breathing, nonlabored ventilation, respiratory function stable and patient connected to nasal cannula oxygen Cardiovascular status: blood pressure returned to baseline and stable Postop Assessment: no apparent nausea or vomiting Anesthetic complications: no    Last Vitals:  Vitals:   09/28/17 2201 09/29/17 0503  BP: 118/61 95/62  Pulse: (!) 107 (!) 105  Resp:  20  Temp:  37.2 C  SpO2:  93%    Last Pain:  Vitals:   09/29/17 0503  TempSrc: Oral  PainSc:                  Tessi Eustache S

## 2017-09-29 NOTE — Progress Notes (Signed)
    Subjective: Patient reports pain as mild to moderate, controlled.  Appetite not great.  No CP, SOB.    Objective:   VITALS:   Vitals:   09/28/17 1545 09/28/17 2000 09/28/17 2201 09/29/17 0503  BP:  (!) 83/57 118/61 95/62  Pulse: 98 (!) 104 (!) 107 (!) 105  Resp: (!) 23   20  Temp: (!) 97 F (36.1 C) 98 F (36.7 C)  98.9 F (37.2 C)  TempSrc:  Oral  Oral  SpO2: 94% 93%  93%  Weight:      Height:       CBC Latest Ref Rng & Units 09/27/2017 06/12/2016  WBC 4.0 - 10.5 K/uL 8.9 9.0  Hemoglobin 13.0 - 17.0 g/dL 12.0(L) 10.4(L)  Hematocrit 39.0 - 52.0 % 36.4(L) 32.0(L)  Platelets 150 - 400 K/uL 230 201   BMP Latest Ref Rng & Units 09/27/2017 06/12/2016  Glucose 65 - 99 mg/dL 161(W243(H) 85  BUN 6 - 20 mg/dL 96(E26(H) 16  Creatinine 4.540.61 - 1.24 mg/dL 0.98(J6.28(H) 1.91(Y3.78(H)  Sodium 135 - 145 mmol/L 133(L) 137  Potassium 3.5 - 5.1 mmol/L 4.4 3.4(L)  Chloride 101 - 111 mmol/L 91(L) 94(L)  CO2 22 - 32 mmol/L 25 31  Calcium 8.9 - 10.3 mg/dL 9.2 8.9   Intake/Output      04/09 0701 - 04/10 0700 04/10 0701 - 04/11 0700   P.O. 222    I.V. (mL/kg) 400 (4.6)    IV Piggyback 0    Total Intake(mL/kg) 622 (7.1)    Blood 300    Total Output 300    Net +322            Physical Exam: General: NAD.  Upright in bed.  Calm, conversant.  No increased wob  MSK LLE: Thigh soft.  Ace wrap and dressings in place. Sensation intact distally Incision: dressing C/D/I   Assessment: 1 Day Post-Op  S/P Procedure(s) (LRB): OPEN REDUCTION INTERNAL FIXATION (ORIF) DISTAL FEMUR FRACTURE (Left) by Dr. Jewel Baizeimothy D. Eulah PontMurphy on 09/28/2017  Principal Problem:   Closed left femoral fracture (HCC) Active Problems:   ESRD (end stage renal disease) (HCC)   S/P bilateral BKA (below knee amputation) (HCC)   HTN (hypertension)   DM (diabetes mellitus), type 2 with renal complications (HCC)   CAD (coronary artery disease)   PAD (peripheral artery disease) (HCC)   Femoral fracture (HCC)   Prolonged QT  interval   Left distal femur fracture status post left BKA Doing well postop day 1. Pain improved/controlled this morning.  Therapy evaluations pending- typically uses a slide board for transitions.  He has a motorized and traditional wheelchair.  He lives with his brother who does not work.  Plan: Up with therapy Incentive Spirometry Elevate and Apply ice Chronic medical issues per primary team.  Typically has dialysis M/W/F.  Weightbearing: NWB LLE Insicional and dressing care: Dressings left intact until follow-up Showering: Keep dressing dry VTE prophylaxis: Aspirin 325mg  for 2 weeks.  Resume Plavix (chronic medicine).  Mobilize with therapy.   Pain control: Tylenol and/or oxycodone.  Elevate and apply ice.  Maintain Ace wrap. Follow - up plan: 1-2 weeks in the office with Dr. Wandra Feinstein Murphy. Contact information:  Margarita Ranaimothy Murphy MD, Aquilla HackerHenry Martensen PA-C  Dispo: Per primary team.  Therapy evaluations pending.  Patient is set up well at home and has support from his brother.  He is hoping to DC ASAP.   Lucretia KernHenry Calvin Martensen III, PA-C 09/29/2017, 7:10 AM

## 2017-09-30 LAB — RENAL FUNCTION PANEL
ANION GAP: 17 — AB (ref 5–15)
Albumin: 2.4 g/dL — ABNORMAL LOW (ref 3.5–5.0)
BUN: 36 mg/dL — ABNORMAL HIGH (ref 6–20)
CALCIUM: 9.1 mg/dL (ref 8.9–10.3)
CHLORIDE: 89 mmol/L — AB (ref 101–111)
CO2: 26 mmol/L (ref 22–32)
Creatinine, Ser: 7.22 mg/dL — ABNORMAL HIGH (ref 0.61–1.24)
GFR calc non Af Amer: 7 mL/min — ABNORMAL LOW (ref >60)
GFR, EST AFRICAN AMERICAN: 8 mL/min — AB (ref >60)
Glucose, Bld: 151 mg/dL — ABNORMAL HIGH (ref 65–99)
POTASSIUM: 4.1 mmol/L (ref 3.5–5.1)
Phosphorus: 7.1 mg/dL — ABNORMAL HIGH (ref 2.5–4.6)
Sodium: 132 mmol/L — ABNORMAL LOW (ref 135–145)

## 2017-09-30 LAB — CBC
HCT: 27.3 % — ABNORMAL LOW (ref 39.0–52.0)
HEMATOCRIT: 24 % — AB (ref 39.0–52.0)
HEMOGLOBIN: 8 g/dL — AB (ref 13.0–17.0)
Hemoglobin: 9 g/dL — ABNORMAL LOW (ref 13.0–17.0)
MCH: 34.2 pg — AB (ref 26.0–34.0)
MCH: 33.8 pg (ref 26.0–34.0)
MCHC: 33 g/dL (ref 30.0–36.0)
MCHC: 33.3 g/dL (ref 30.0–36.0)
MCV: 103.8 fL — AB (ref 78.0–100.0)
MCV: 101.3 fL — AB (ref 78.0–100.0)
PLATELETS: 217 10*3/uL (ref 150–400)
Platelets: 199 10*3/uL (ref 150–400)
RBC: 2.63 MIL/uL — ABNORMAL LOW (ref 4.22–5.81)
RBC: 2.37 MIL/uL — AB (ref 4.22–5.81)
RDW: 15 % (ref 11.5–15.5)
RDW: 14.5 % (ref 11.5–15.5)
WBC: 9.3 10*3/uL (ref 4.0–10.5)
WBC: 6.2 10*3/uL (ref 4.0–10.5)

## 2017-09-30 LAB — BASIC METABOLIC PANEL
Anion gap: 18 — ABNORMAL HIGH (ref 5–15)
BUN: 24 mg/dL — AB (ref 6–20)
CO2: 26 mmol/L (ref 22–32)
CREATININE: 6.09 mg/dL — AB (ref 0.61–1.24)
Calcium: 9.3 mg/dL (ref 8.9–10.3)
Chloride: 90 mmol/L — ABNORMAL LOW (ref 101–111)
GFR calc Af Amer: 9 mL/min — ABNORMAL LOW (ref >60)
GFR, EST NON AFRICAN AMERICAN: 8 mL/min — AB (ref >60)
GLUCOSE: 164 mg/dL — AB (ref 65–99)
Potassium: 4.2 mmol/L (ref 3.5–5.1)
SODIUM: 134 mmol/L — AB (ref 135–145)

## 2017-09-30 LAB — GLUCOSE, CAPILLARY
GLUCOSE-CAPILLARY: 165 mg/dL — AB (ref 65–99)
Glucose-Capillary: 160 mg/dL — ABNORMAL HIGH (ref 65–99)
Glucose-Capillary: 163 mg/dL — ABNORMAL HIGH (ref 65–99)
Glucose-Capillary: 168 mg/dL — ABNORMAL HIGH (ref 65–99)
Glucose-Capillary: 220 mg/dL — ABNORMAL HIGH (ref 65–99)

## 2017-09-30 MED ORDER — LACTULOSE 10 GM/15ML PO SOLN
30.0000 g | Freq: Once | ORAL | Status: DC
  Filled 2017-09-30: qty 45

## 2017-09-30 MED ORDER — LIDOCAINE HCL (PF) 1 % IJ SOLN: 5.0000 mL | INTRAMUSCULAR | Status: DC | PRN

## 2017-09-30 MED ORDER — HEPARIN SODIUM (PORCINE) 1000 UNIT/ML DIALYSIS
1000.0000 [IU] | INTRAMUSCULAR | Status: DC | PRN
  Filled 2017-09-30: qty 1

## 2017-09-30 MED ORDER — INSULIN GLARGINE 100 UNIT/ML ~~LOC~~ SOLN
15.0000 [IU] | Freq: Every day | SUBCUTANEOUS | Status: DC
  Administered 2017-09-30 – 2017-10-01 (×2): 15 [IU] via SUBCUTANEOUS
  Filled 2017-09-30 (×3): qty 0.15

## 2017-09-30 MED ORDER — INSULIN ASPART 100 UNIT/ML ~~LOC~~ SOLN: 0.0000 [IU] | Freq: Every day | SUBCUTANEOUS | Status: DC

## 2017-09-30 MED ORDER — INSULIN ASPART 100 UNIT/ML ~~LOC~~ SOLN
0.0000 [IU] | Freq: Three times a day (TID) | SUBCUTANEOUS | Status: DC
  Administered 2017-09-30: 3 [IU] via SUBCUTANEOUS
  Administered 2017-10-01 – 2017-10-03 (×2): 1 [IU] via SUBCUTANEOUS

## 2017-09-30 MED ORDER — LIDOCAINE-PRILOCAINE 2.5-2.5 % EX CREA
1.0000 "application " | TOPICAL_CREAM | CUTANEOUS | Status: DC | PRN
  Filled 2017-09-30: qty 5

## 2017-09-30 MED ORDER — ALTEPLASE 2 MG IJ SOLR
2.0000 mg | Freq: Once | INTRAMUSCULAR | Status: DC | PRN
  Filled 2017-09-30: qty 2

## 2017-09-30 MED ORDER — PENTAFLUOROPROP-TETRAFLUOROETH EX AERO: 1.0000 "application " | INHALATION_SPRAY | CUTANEOUS | Status: DC | PRN

## 2017-09-30 MED ORDER — SODIUM CHLORIDE 0.9 % IV SOLN: 100.0000 mL | INTRAVENOUS | Status: DC | PRN

## 2017-09-30 MED ORDER — POLYETHYLENE GLYCOL 3350 17 G PO PACK: 17.0000 g | PACK | Freq: Every day | ORAL | Status: DC | PRN

## 2017-09-30 NOTE — Progress Notes (Addendum)
[facility] Progress Note   Subjective:   Feeling better today.  Dialysis well tolerated except "too long"- no fluid removal.  Pain well controlled.  Objective Vitals:   09/29/17 1500 09/29/17 1530 09/29/17 1555 09/30/17 0425  BP: (!) 90/50 (!) 104/50 (!) 100/50 (!) 118/57  Pulse: (!) 110 99 100 (!) 105  Resp:   18   Temp:   98 F (36.7 C) 99.6 F (37.6 C)  TempSrc:   Oral Oral  SpO2:    91%  Weight:   87 kg (191 lb 12.8 oz)   Height:       Physical Exam General:NAD, chronically ill appearing male Heart:RRR, 2/6 systolic murmur, no rub or gallop Lungs:CTAB anterior/laterally, nml WOB Abdomen:soft, NTND, +BS Extremities:b/l BKA, L wrapped, R in sock.  No edema Dialysis Access: LU AVF +b/t   Filed Weights   09/27/17 1835 09/29/17 1555  Weight: 87.1 kg (192 lb) 87 kg (191 lb 12.8 oz)    Intake/Output Summary (Last 24 hours) at 09/30/2017 0808 Last data filed at 09/29/2017 1555 Gross per 24 hour  Intake 360 ml  Output -200 ml  Net 560 ml    Additional Objective Labs: Basic Metabolic Panel: Recent Labs  Lab 09/27/17 2123 09/29/17 0655  NA 133* 135  K 4.4 4.8  CL 91* 92*  CO2 25 23  GLUCOSE 243* 146*  BUN 26* 44*  CREATININE 6.28* 9.69*  CALCIUM 9.2 9.3   Liver Function Tests: Recent Labs  Lab 09/27/17 2123 09/29/17 0655  AST 45* 48*  ALT 20 16*  ALKPHOS 53 45  BILITOT 1.0 1.0  PROT 7.3 6.4*  ALBUMIN 3.5 2.9*   CBC: Recent Labs  Lab 09/27/17 2123 09/29/17 0655 09/30/17 0702  WBC 8.9 8.7 9.3  NEUTROABS 7.4  --   --   HGB 12.0* 9.4* 9.0*  HCT 36.4* 28.6* 27.3*  MCV 101.1* 102.9* 103.8*  PLT 230 193 217   CBG: Recent Labs  Lab 09/29/17 1109 09/29/17 1656 09/29/17 1957 09/30/17 0053 09/30/17 0419  GLUCAP 182* 111* 185* 160* 163*   Studies/Results: Dg C-arm 1-60 Min  Result Date: 09/28/2017 CLINICAL DATA:  77 year old male with distal left femur fracture. Subsequent encounter. EXAM: DG C-ARM 61-120 MIN; LEFT FEMUR 2  VIEWS Fluoroscopic time: 2 minutes and 24 seconds COMPARISON:  09/27/2017 plain film exam. FINDINGS: Four intraoperative C-arm views submitted for review after procedure. Remote left hip pinning. Left femoral intramedullary rod placed across the distal left femur fracture with proximal and distal fixation screws. Fracture fragments slightly separated and angulated. Prominent vascular calcifications. IMPRESSION: Open reduction and internal fixation of distal left femur fracture as detailed above. Electronically Signed   By: Lacy Duverney M.D.   On: 09/28/2017 14:40   Dg Femur Min 2 Views Left  Result Date: 09/28/2017 CLINICAL DATA:  77 year old male with distal left femur fracture. Subsequent encounter. EXAM: DG C-ARM 61-120 MIN; LEFT FEMUR 2 VIEWS Fluoroscopic time: 2 minutes and 24 seconds COMPARISON:  09/27/2017 plain film exam. FINDINGS: Four intraoperative C-arm views submitted for review after procedure. Remote left hip pinning. Left femoral intramedullary rod placed across the distal left femur fracture with proximal and distal fixation screws. Fracture fragments slightly separated and angulated. Prominent vascular calcifications. IMPRESSION: Open reduction and internal fixation of distal left femur fracture as detailed above. Electronically Signed   By: Lacy Duverney M.D.   On: 09/28/2017 14:40    Medications: . methocarbamol (ROBAXIN)  IV     . aspirin  325 mg Oral Daily  . atorvastatin  80 mg Oral QPM  . Chlorhexidine Gluconate Cloth  6 each Topical Q0600  . clopidogrel  75 mg Oral QPM  . docusate sodium  100 mg Oral BID  . doxercalciferol  3 mcg Intravenous Q M,W,F-HD  . feeding supplement (NEPRO CARB STEADY)  237 mL Oral BID BM  . heparin injection (subcutaneous)  5,000 Units Subcutaneous Q8H  . insulin aspart  0-9 Units Subcutaneous Q4H  . insulin glargine  10 Units Subcutaneous QHS  . multivitamin  1 tablet Oral QHS  . mupirocin ointment  1 application Nasal BID  . sevelamer  carbonate  2,400 mg Oral TID WC    Dialysis Orders: MWF - [facility]  4hrs, BFR 400, DFR 800,  EDW 82.5kg, 2K/ 2.5Ca, Linear Na   Access: LU AVF  Heparin 5000 Unit bolus Hectorol 3mcg qHD   Assessment/Plan: 1. L distal femur fracture - b/l BKA, s/p ORIF on 4/9 Dr. Eulah PontMurphy. Pain well controlled.  D/c to SNF likely not sure when.  Per primary.   2.  ESRD -  Continue HD per regular MWF schedule via AVF while admitted. K 4.2. 3.  Hypotension/volume  - Bp mostly low.  No meds.  Appears euvolemic on exam. Titrate down as tolerated.  4.  Anemia  - Hgb 9.4>9.0 post surgery. Follow trend. No ESA as OP, no IV iron due to allergy.  5.  Secondary Hyperparathyroidism -  CCa 10.2. No P. Continue VDRA and binders. Changed to 2K/2Ca bath d/t borderline high Ca. Follow trend 6.  Nutrition - Alb 2.9. Renal diet with fluid restrictions. Nepro.  7. DMT2 - on insulin, per primary 8. CAD - cardio consulted for pre op clearance, now signed off. Per primary   Virgina NorfolkLindsay Penninger, PA-C [facility] Pager: [phone] 09/30/2017,8:08 AM  LOS: 3 days   Patient seen and examined, agree with above note with above modifications. No new issues, HD went well yesterday- plans for SNF placement- need to make sure HD is being considered in the search like if he needs to change OP units at a SNF- HD orders written for tomorrow if is still here Annie SableKellie Deloma Spindle, MD 09/30/2017

## 2017-09-30 NOTE — Progress Notes (Signed)
Subjective: Patient reports pain as mild to moderate, controlled, significantly improved.  Appetite better.  No CP, SOB.    Objective:   VITALS:   Vitals:   09/29/17 1500 09/29/17 1530 09/29/17 1555 09/30/17 0425  BP: (!) 90/50 (!) 104/50 (!) 100/50 (!) 118/57  Pulse: (!) 110 99 100 (!) 105  Resp:   18   Temp:   98 F (36.7 C) 99.6 F (37.6 C)  TempSrc:   Oral Oral  SpO2:    91%  Weight:   87 kg (191 lb 12.8 oz)   Height:       CBC Latest Ref Rng & Units 09/30/2017 09/29/2017 09/27/2017  WBC 4.0 - 10.5 K/uL 9.3 8.7 8.9  Hemoglobin 13.0 - 17.0 g/dL 9.0(L) 9.4(L) 12.0(L)  Hematocrit 39.0 - 52.0 % 27.3(L) 28.6(L) 36.4(L)  Platelets 150 - 400 K/uL 217 193 230   BMP Latest Ref Rng & Units 09/30/2017 09/29/2017 09/27/2017  Glucose 65 - 99 mg/dL 409(W) 119(J) 478(G)  BUN 6 - 20 mg/dL 95(A) 21(H) 08(M)  Creatinine 0.61 - 1.24 mg/dL 5.78(I) 6.96(E) 9.52(W)  Sodium 135 - 145 mmol/L 134(L) 135 133(L)  Potassium 3.5 - 5.1 mmol/L 4.2 4.8 4.4  Chloride 101 - 111 mmol/L 90(L) 92(L) 91(L)  CO2 22 - 32 mmol/L 26 23 25   Calcium 8.9 - 10.3 mg/dL 9.3 9.3 9.2   Intake/Output      04/10 0701 - 04/11 0700 04/11 0701 - 04/12 0700   P.O. 360    I.V. (mL/kg)     IV Piggyback     Total Intake(mL/kg) 360 (4.1)    Other -200    Blood     Total Output -200    Net +560            Physical Exam: General: NAD.  Upright in bed.  Calm, conversant.  No increased wob  MSK LLE: Thigh soft.  Ace wrap and dressings in place. Sensation intact distally Incision: dressing C/D/I   Assessment: 2 Days Post-Op  S/P Procedure(s) (LRB): OPEN REDUCTION INTERNAL FIXATION (ORIF) DISTAL FEMUR FRACTURE (Left) by Dr. Jewel Baize. Eulah Pont on 09/28/2017  Principal Problem:   Closed left femoral fracture (HCC) Active Problems:   ESRD (end stage renal disease) (HCC)   S/P bilateral BKA (below knee amputation) (HCC)   HTN (hypertension)   DM (diabetes mellitus), type 2 with renal complications (HCC)   CAD  (coronary artery disease)   PAD (peripheral artery disease) (HCC)   Femoral fracture (HCC)   Prolonged QT interval   Left distal femur fracture status post left BKA Doing well postop day 2. Pain controlled. PT evaluation pending.  Patient believes SNF is best short-term option. Stable from an orthopedic perspective.  Uses a slide board for transitions.  He has a motorized and traditional wheelchair.  He lives with his brother.    Plan: Up with therapy Incentive Spirometry Elevate and Apply ice Chronic medical issues per primary team.     Weightbearing: NWB LLE Insicional and dressing care: Dressings left intact until follow-up Showering: Keep dressing dry VTE prophylaxis: Aspirin 325mg  for 2 weeks.  Resume Plavix (chronic medicine).  Mobilize with therapy.   Pain control: Tylenol and/or oxycodone.  Elevate and apply ice.  Maintain Ace wrap. Contact information:  Margarita Rana MD, Aquilla Hacker PA-C  Dispo: Per primary team.  SNF planning in progress. Follow - up plan: 1-2 weeks in the office with Dr. Wandra Feinstein.  Please call with questions or concerns.  Todd BilletHenry Calvin Martensen III, PA-C 09/30/2017, 8:43 AM

## 2017-09-30 NOTE — NC FL2 (Signed)
[place] MEDICAID FL2 LEVEL OF CARE SCREENING TOOL     IDENTIFICATION  Patient Name: Todd Coleman Birthdate: 11/15/1940 Sex: male Admission Date (Current Location): 09/27/2017  [facility] [place]   Facility and Address:  [facility], [address], [place], [place] [address]      Provider Number: [id number]  Attending Physician Name and Address:  Cathren Harshai, Ripudeep K, MD  Relative Name and Phone Number:  Ilda MoriCindy Policastro, sister, [phone]    Current Level of Care: Hospital Recommended Level of Care: Skilled Nursing Facility Prior Approval Number:    Date Approved/Denied:   PASRR Number: [id number]  Discharge Plan: SNF    Current Diagnoses: Patient Active Problem List   Diagnosis Date Noted  . Prolonged QT interval 09/28/2017  . ESRD (end stage renal disease) (HCC) 09/27/2017  . S/P bilateral BKA (below knee amputation) (HCC) 09/27/2017  . HTN (hypertension) 09/27/2017  . DM (diabetes mellitus), type 2 with renal complications (HCC) 09/27/2017  . CAD (coronary artery disease) 09/27/2017  . PAD (peripheral artery disease) (HCC) 09/27/2017  . Closed left femoral fracture (HCC) 09/27/2017  . Femoral fracture (HCC) 09/27/2017    Orientation RESPIRATION BLADDER Height & Weight     Self, Time, Situation, Place  Normal Continent Weight: 191 lb 12.8 oz (87 kg) Height:  5\' 5"  (165.1 cm)  BEHAVIORAL SYMPTOMS/MOOD NEUROLOGICAL BOWEL NUTRITION STATUS      Continent Diet(See DC Summary)  AMBULATORY STATUS COMMUNICATION OF NEEDS Skin   Extensive Assist Verbally Surgical wounds                       Personal Care Assistance Level of Assistance  Bathing, Feeding, Dressing Bathing Assistance: Maximum assistance Feeding assistance: Limited assistance Dressing Assistance: Maximum assistance     Functional Limitations Info  Sight, Hearing, Speech Sight Info: Adequate Hearing Info: Adequate Speech Info: Adequate     SPECIAL CARE FACTORS FREQUENCY  PT (By licensed PT), OT (By licensed OT)     PT Frequency: 5x week OT Frequency: 2x week            Contractures      Additional Factors Info  Code Status, Allergies, Isolation Precautions Code Status Info: DNR Allergies Info: DILAUDID HYDROMORPHONE HCL, PENICILLINS, RAMIPRIL      Isolation Precautions Info: MRSA     Current Medications (09/30/2017):  This is the current hospital active medication list Current Facility-Administered Medications  Medication Dose Route Frequency Provider Last Rate Last Dose  . 0.9 %  sodium chloride infusion  100 mL Intravenous PRN Penninger, Lillia AbedLindsay, PA      . 0.9 %  sodium chloride infusion  100 mL Intravenous PRN Penninger, Lillia AbedLindsay, PA      . acetaminophen (TYLENOL) tablet 325-650 mg  325-650 mg Oral Q6H PRN Albina BilletMartensen, Henry Calvin III, PA-C      . alteplase (CATHFLO ACTIVASE) injection 2 mg  2 mg Intracatheter Once PRN Penninger, Lillia AbedLindsay, PA      . aspirin tablet 325 mg  325 mg Oral Daily Albina BilletMartensen, Henry Calvin III, PA-C   325 mg at 09/30/17 0954  . atorvastatin (LIPITOR) tablet 80 mg  80 mg Oral QPM Albina BilletMartensen, Henry Calvin III, PA-C   80 mg at 09/30/17 1820  . Chlorhexidine Gluconate Cloth 2 % PADS 6 each  6 each Topical Q0600 Darlin DropHall, Carole N, DO   6 each at 09/30/17 0500  . clopidogrel (PLAVIX) tablet 75 mg  75 mg Oral QPM  Albina Billet III, PA-C   75 mg at 09/30/17 0954  . docusate sodium (COLACE) capsule 100 mg  100 mg Oral BID Albina Billet III, PA-C   100 mg at 09/30/17 2218  . doxercalciferol (HECTOROL) injection 3 mcg  3 mcg Intravenous Q M,W,F-HD Penninger, Lillia Abed, PA      . feeding supplement (NEPRO CARB STEADY) liquid 237 mL  237 mL Oral BID BM Penninger, Lindsay, PA      . fentaNYL (SUBLIMAZE) injection 50 mcg  50 mcg Intravenous Q2H PRN Albina Billet III, PA-C   50 mcg at 09/30/17 0815  . heparin injection 1,000 Units  1,000 Units Dialysis PRN Penninger, Lillia Abed, PA       . heparin injection 5,000 Units  5,000 Units Subcutaneous Q8H Rai, Ripudeep K, MD   5,000 Units at 09/30/17 1252  . insulin aspart (novoLOG) injection 0-5 Units  0-5 Units Subcutaneous QHS Rai, Ripudeep K, MD      . insulin aspart (novoLOG) injection 0-9 Units  0-9 Units Subcutaneous TID WC Rai, Ripudeep K, MD   3 Units at 09/30/17 1729  . insulin glargine (LANTUS) injection 15 Units  15 Units Subcutaneous QHS Rai, Ripudeep K, MD   15 Units at 09/30/17 2218  . lactulose (CHRONULAC) 10 GM/15ML solution 30 g  30 g Oral Once Rai, Ripudeep K, MD      . lidocaine (PF) (XYLOCAINE) 1 % injection 5 mL  5 mL Intradermal PRN Penninger, Lillia Abed, PA      . lidocaine-prilocaine (EMLA) cream 1 application  1 application Topical PRN Penninger, Lillia Abed, PA      . methocarbamol (ROBAXIN) tablet 500 mg  500 mg Oral Q6H PRN Albina Billet III, PA-C   500 mg at 09/29/17 1715   Or  . methocarbamol (ROBAXIN) 500 mg in dextrose 5 % 50 mL IVPB  500 mg Intravenous Q6H PRN Albina Billet III, PA-C      . metoCLOPramide (REGLAN) injection 5 mg  5 mg Intravenous Q6H PRN Albina Billet III, PA-C   5 mg at 09/30/17 0955  . morphine 2 MG/ML injection 1 mg  1 mg Intravenous Q2H PRN Albina Billet III, PA-C      . multivitamin (RENA-VIT) tablet 1 tablet  1 tablet Oral QHS Penninger, Lindsay, [place]   1 tablet at 09/30/17 2219  . mupirocin ointment (BACTROBAN) 2 % 1 application  1 application Nasal BID Darlin Drop, DO   1 application at 09/30/17 2219  . oxyCODONE (Oxy IR/ROXICODONE) immediate release tablet 5-10 mg  5-10 mg Oral Q4H PRN Albina Billet III, PA-C   10 mg at 09/29/17 1715  . pentafluoroprop-tetrafluoroeth (GEBAUERS) aerosol 1 application  1 application Topical PRN Penninger, Lillia Abed, PA      . polyethylene glycol (MIRALAX / GLYCOLAX) packet 17 g  17 g Oral Daily PRN Rai, Ripudeep K, MD      . sevelamer carbonate (RENVELA) tablet 2,400 mg  2,400 mg Oral TID WC Penninger,  Lindsay, PA   2,400 mg at 09/30/17 1729     Discharge Medications: Please see discharge summary for a list of discharge medications.  Relevant Imaging Results:  Relevant Lab Results:   Additional Information SS#: [id number]     Dialysis M  [facility]  Rolanda Lundborg Nalina Yeatman, LCSW

## 2017-09-30 NOTE — Progress Notes (Signed)
[facility]                                                                              Patient Demographics  Todd Coleman, is a 77 y.o. male, DOB - [birth year], ZOX:[id number]  Admit date - 09/27/2017   Admitting Physician Therisa Doyne, MD  Outpatient Primary MD for the patient is Romie Jumper, PA-C  Outpatient specialists:   LOS - 3  days   Medical records reviewed and are as summarized below:    Chief Complaint  Patient presents with  . Fall       Brief summary   Patient is a 77 year old male with diabetes mellitus on insulin, CAD, hypertension, hyperlipidemia status post bilateral BKA, ESRD on HD, MWF.  Patient fell out of his wheelchair after accidentally hitting a curb, hitting his face and knee and sustained a left femoral fracture.  Orthopedics was consulted, patient was admitted for further workup.   Assessment & Plan    Principal Problem:   Closed left femoral fracture (HCC) -Status post ORIF distal femur fracture, left on 4/9, postop day # 2 - DVT prophylaxis, pain control per orthopedics  -H&H currently stable -PT evaluation recommended skilled nursing facility, patient agreeable, social work consult placed  Active Problems:   ESRD (end stage renal disease) (HCC) -On hemodialysis MWF, nephrology consulted -Next HD on 4/12  Essential HTN (hypertension) -BP still soft, continue to hold antihypertensives    DM (diabetes mellitus), type 2 with renal complications (HCC), uncontrolled with hyperglycemia, insulin-dependent -Change sliding scale to sensitive coverage, increase Lantus to 15 units at bedtime -Hemoglobin A1c six-point  CAD (coronary artery disease), PAD (peripheral artery disease) (HCC) - continue Plavix, statin  -Cardiology was consulted for surgical clearance    Code Status: DNR DVT Prophylaxis: Heparin subcu Family Communication: Discussed in detail with the patient, all imaging results, lab results explained to  the patient    Disposition Plan: Skilled nursing facility when bed available possibly in a.m.  Time Spent in minutes 25 minutes  Procedures:  Hemodialysis ORIF left femur fracture  Consultants:   Orthopedics Nephrology  Antimicrobials:   None   Medications  Scheduled Meds: . aspirin  325 mg Oral Daily  . atorvastatin  80 mg Oral QPM  . Chlorhexidine Gluconate Cloth  6 each Topical Q0600  . clopidogrel  75 mg Oral QPM  . docusate sodium  100 mg Oral BID  . doxercalciferol  3 mcg Intravenous Q M,W,F-HD  . feeding supplement (NEPRO CARB STEADY)  237 mL Oral BID BM  . heparin injection (subcutaneous)  5,000 Units Subcutaneous Q8H  . insulin aspart  0-9 Units Subcutaneous Q4H  . insulin glargine  10 Units Subcutaneous QHS  . lactulose  30 g Oral Once  . multivitamin  1 tablet Oral QHS  . mupirocin ointment  1 application Nasal BID  . sevelamer carbonate  2,400 mg Oral TID WC   Continuous Infusions: . methocarbamol (ROBAXIN)  IV     PRN Meds:.acetaminophen, fentaNYL (SUBLIMAZE) injection, methocarbamol **OR** methocarbamol (ROBAXIN)  IV, metoCLOPramide (REGLAN) injection, morphine injection, oxyCODONE, polyethylene glycol   Antibiotics   Anti-infectives (From admission,  onward)   Start     Dose/Rate Route Frequency Ordered Stop   09/29/17 0800  ceFAZolin (ANCEF) IVPB 2g/100 mL premix     2 g 200 mL/hr over 30 Minutes Intravenous Every 6 hours 09/28/17 1549 09/29/17 1706   09/28/17 0745  ceFAZolin (ANCEF) IVPB 2g/100 mL premix  Status:  Discontinued     2 g 200 mL/hr over 30 Minutes Intravenous On call to O.R. 09/28/17 0731 09/28/17 0747   09/28/17 0745  ceFAZolin (ANCEF) IVPB 2g/100 mL premix     2 g 200 mL/hr over 30 Minutes Intravenous On call to O.R. 09/28/17 0744 09/28/17 1320        Subjective:   Todd Coleman was seen and examined today.  States pain is controlled today, feels better.  Agreeable for rehab.  Patient denies dizziness, chest pain,  shortness of breath, abdominal pain, N/V/D/C. No acute events overnight.    Objective:   Vitals:   09/29/17 1500 09/29/17 1530 09/29/17 1555 09/30/17 0425  BP: (!) 90/50 (!) 104/50 (!) 100/50 (!) 118/57  Pulse: (!) 110 99 100 (!) 105  Resp:   18   Temp:   98 F (36.7 C) 99.6 F (37.6 C)  TempSrc:   Oral Oral  SpO2:    91%  Weight:   87 kg (191 lb 12.8 oz)   Height:        Intake/Output Summary (Last 24 hours) at 09/30/2017 1322 Last data filed at 09/29/2017 1555 Gross per 24 hour  Intake -  Output -200 ml  Net 200 ml     Wt Readings from Last 3 Encounters:  09/29/17 87 kg (191 lb 12.8 oz)  08/11/17 87.1 kg (192 lb)  08/01/16 86.2 kg (190 lb)     Exam    General: Alert and oriented x 3, NAD  Eyes:  HEENT:    Cardiovascular: S1 S2 auscultated,Regular rate and rhythm. No pedal edema b/l  Respiratory: Clear to auscultation bilaterally, no wheezing, rales or rhonchi  Gastrointestinal: Soft, nontender, nondistended, + bowel sounds  Ext: bilateral lower extremity amputation  Neuro: no new deficits  Musculoskeletal: No digital cyanosis, clubbing  Skin: No rashes  Psych: Normal affect and demeanor, alert and oriented x3    Data Reviewed:  I have personally reviewed following labs and imaging studies  Micro Results Recent Results (from the past 240 hour(s))  Surgical pcr screen     Status: Abnormal   Collection Time: 09/28/17  5:54 AM  Result Value Ref Range Status   MRSA, PCR POSITIVE (A) NEGATIVE Final    Comment: RESULT CALLED TO, READ BACK BY AND VERIFIED WITH: M. Peppeh RN 9:40 09/28/17 ([email or web address])    Staphylococcus aureus POSITIVE (A) NEGATIVE Final    Comment: (NOTE) The Xpert SA Assay (FDA approved for NASAL specimens in patients 47 years of age and older), is one component of a comprehensive surveillance program. It is not intended to diagnose infection nor to guide or monitor treatment.     Radiology Reports Dg Knee 1-2 Views Left  Result  Date: 09/27/2017 CLINICAL DATA:  LEFT hip and knee pain after fall from wheelchair. History of LEFT hip surgery. EXAM: LEFT KNEE - 1-2 VIEW COMPARISON:  None available for comparison at time of study interpretation. FINDINGS: Limited assessment due to nonstandard views, per technologist patient was in pain and had difficulty maintaining position for imaging. Status post LEFT below-knee amputation. Acute comminuted impacted femoral metadiaphysis fracture. No definite intra-articular extension. Osteopenia. No destructive bony lesions.  Severe vascular calcification and stent and popliteal fossa. IMPRESSION: Acute distal femur fracture.  No dislocation. Status post below-knee amputation. Electronically Signed   By: Awilda Metro M.D.   On: 09/27/2017 20:48   Ct Head Wo Contrast  Result Date: 09/27/2017 CLINICAL DATA:  Fall from wheelchair. EXAM: CT HEAD WITHOUT CONTRAST CT CERVICAL SPINE WITHOUT CONTRAST TECHNIQUE: Multidetector CT imaging of the head and cervical spine was performed following the standard protocol without intravenous contrast. Multiplanar CT image reconstructions of the cervical spine were also generated. COMPARISON:  08/03/2016 FINDINGS: CT HEAD FINDINGS Brain: There is diffuse low attenuation throughout the subcortical and periventricular white matter compatible with chronic small vessel ischemic disease. Prominence of the sulci and ventricles are noted compatible with brain atrophy. No evidence for acute infarction, hemorrhage, hydrocephalus, extra-axial collection or mass lesion/mass effect. Vascular: No hyperdense vessel or unexpected calcification. Skull: Normal. Negative for fracture or focal lesion. Sinuses/Orbits: There is mild mucosal thickening involving the right maxillary sinus. Partial opacification of the left mastoid air cells. Other: None CT CERVICAL SPINE FINDINGS Alignment: Normal. Skull base and vertebrae: No acute fracture. No primary bone lesion or focal pathologic process.  Soft tissues and spinal canal: No prevertebral fluid or swelling. No visible canal hematoma. Disc levels: Disc space narrowing and ventral spurring noted at C5-6 and C6-7. Upper chest: Negative. Other: None IMPRESSION: 1. No acute intracranial abnormalities. Chronic small vessel ischemic disease and brain atrophy noted. 2. No evidence for cervical spine fracture or dislocation. 3. 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 Electronically Signed   By: Signa Kell M.D.   On: 09/27/2017 20:50   Ct Cervical Spine Wo Contrast  Result Date: 09/27/2017 CLINICAL DATA:  Fall from wheelchair. EXAM: CT HEAD WITHOUT CONTRAST CT CERVICAL SPINE WITHOUT CONTRAST TECHNIQUE: Multidetector CT imaging of the head and cervical spine was performed following the standard protocol without intravenous contrast. Multiplanar CT image reconstructions of the cervical spine were also generated. COMPARISON:  08/03/2016 FINDINGS: CT HEAD FINDINGS Brain: There is diffuse low attenuation throughout the subcortical and periventricular white matter compatible with chronic small vessel ischemic disease. Prominence of the sulci and ventricles are noted compatible with brain atrophy. No evidence for acute infarction, hemorrhage, hydrocephalus, extra-axial collection or mass lesion/mass effect. Vascular: No hyperdense vessel or unexpected calcification. Skull: Normal. Negative for fracture or focal lesion. Sinuses/Orbits: There is mild mucosal thickening involving the right maxillary sinus. Partial opacification of the left mastoid air cells. Other: None CT CERVICAL SPINE FINDINGS Alignment: Normal. Skull base and vertebrae: No acute fracture. No primary bone lesion or focal pathologic process. Soft tissues and spinal canal: No prevertebral fluid or swelling. No visible canal hematoma. Disc levels: Disc space narrowing and ventral spurring noted at C5-6 and C6-7. Upper chest: Negative. Other: None IMPRESSION: 1. No acute  intracranial abnormalities. Chronic small vessel ischemic disease and brain atrophy noted. 2. No evidence for cervical spine fracture or dislocation. 3. 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 Electronically Signed   By: Signa Kell M.D.   On: 09/27/2017 20:50   Dg Chest Port 1 View  Result Date: 09/27/2017 CLINICAL DATA:  Fall out of wheelchair today. EXAM: PORTABLE CHEST 1 VIEW COMPARISON:  Radiograph 08/30/2017 FINDINGS: Low lung volumes. Similar in size and mediastinal contours to prior exam allowing for differences in technique. Improved pulmonary edema and vascular congestion. Streaky left lung base atelectasis or scarring. No focal airspace disease. No pneumothorax or large pleural effusion. Vascular stent in the left axilla  again seen. No evidence of acute osseous abnormality. IMPRESSION: Low lung volumes without acute abnormality or evidence of acute traumatic injury. Electronically Signed   By: Rubye Oaks M.D.   On: 09/27/2017 21:38   Dg C-arm 1-60 Min  Result Date: 09/28/2017 CLINICAL DATA:  77 year old male with distal left femur fracture. Subsequent encounter. EXAM: DG C-ARM 61-120 MIN; LEFT FEMUR 2 VIEWS Fluoroscopic time: 2 minutes and 24 seconds COMPARISON:  09/27/2017 plain film exam. FINDINGS: Four intraoperative C-arm views submitted for review after procedure. Remote left hip pinning. Left femoral intramedullary rod placed across the distal left femur fracture with proximal and distal fixation screws. Fracture fragments slightly separated and angulated. Prominent vascular calcifications. IMPRESSION: Open reduction and internal fixation of distal left femur fracture as detailed above. Electronically Signed   By: Lacy Duverney M.D.   On: 09/28/2017 14:40   Dg Hip Unilat W Or Wo Pelvis 2-3 Views Left  Result Date: 09/27/2017 CLINICAL DATA:  Left hip and knee pain after fall from wheelchair today. EXAM: DG HIP (WITH OR WITHOUT PELVIS) 2-3V LEFT COMPARISON:   06/13/2016 intraoperative radiograph of the left hip. FINDINGS: Four cannulated screws are noted along the long axis of the left femoral neck traversing a chronic subcapital impacted left femoral neck fracture. No change in configuration. No evidence of acute fracture, hardware failure or joint dislocation. Intact bony pelvis. Lower lumbar facet arthropathy at L5-S1. Aorto-bi-iliofemoral atherosclerosis with vascular surgical clips projecting over the groin bilaterally. IMPRESSION: Negative for acute fracture or malalignment. Intact left femoral neck fixation. Electronically Signed   By: Tollie Eth M.D.   On: 09/27/2017 20:19   Dg Femur Min 2 Views Left  Result Date: 09/28/2017 CLINICAL DATA:  77 year old male with distal left femur fracture. Subsequent encounter. EXAM: DG C-ARM 61-120 MIN; LEFT FEMUR 2 VIEWS Fluoroscopic time: 2 minutes and 24 seconds COMPARISON:  09/27/2017 plain film exam. FINDINGS: Four intraoperative C-arm views submitted for review after procedure. Remote left hip pinning. Left femoral intramedullary rod placed across the distal left femur fracture with proximal and distal fixation screws. Fracture fragments slightly separated and angulated. Prominent vascular calcifications. IMPRESSION: Open reduction and internal fixation of distal left femur fracture as detailed above. Electronically Signed   By: Lacy Duverney M.D.   On: 09/28/2017 14:40    Lab Data:  CBC: Recent Labs  Lab 09/27/17 2123 09/29/17 0655 09/30/17 0702  WBC 8.9 8.7 9.3  NEUTROABS 7.4  --   --   HGB 12.0* 9.4* 9.0*  HCT 36.4* 28.6* 27.3*  MCV 101.1* 102.9* 103.8*  PLT 230 193 217   Basic Metabolic Panel: Recent Labs  Lab 09/27/17 2123 09/29/17 0655 09/30/17 0702  NA 133* 135 134*  K 4.4 4.8 4.2  CL 91* 92* 90*  CO2 25 23 26   GLUCOSE 243* 146* 164*  BUN 26* 44* 24*  CREATININE 6.28* 9.69* 6.09*  CALCIUM 9.2 9.3 9.3   GFR: Estimated Creatinine Clearance: 10.8 mL/min (A) (by C-G formula based on  SCr of 6.09 mg/dL (H)). Liver Function Tests: Recent Labs  Lab 09/27/17 2123 09/29/17 0655  AST 45* 48*  ALT 20 16*  ALKPHOS 53 45  BILITOT 1.0 1.0  PROT 7.3 6.4*  ALBUMIN 3.5 2.9*   No results for input(s): LIPASE, AMYLASE in the last 168 hours. No results for input(s): AMMONIA in the last 168 hours. Coagulation Profile: No results for input(s): INR, PROTIME in the last 168 hours. Cardiac Enzymes: No results for input(s): CKTOTAL, CKMB, CKMBINDEX, TROPONINI  in the last 168 hours. BNP (last 3 results) No results for input(s): PROBNP in the last 8760 hours. HbA1C: Recent Labs    09/28/17 0528  HGBA1C 6.2*   CBG: Recent Labs  Lab 09/29/17 1957 09/30/17 0053 09/30/17 0419 09/30/17 0813 09/30/17 1127  GLUCAP 185* 160* 163* 165* 168*   Lipid Profile: No results for input(s): CHOL, HDL, LDLCALC, TRIG, CHOLHDL, LDLDIRECT in the last 72 hours. Thyroid Function Tests: No results for input(s): TSH, T4TOTAL, FREET4, T3FREE, THYROIDAB in the last 72 hours. Anemia Panel: No results for input(s): VITAMINB12, FOLATE, FERRITIN, TIBC, IRON, RETICCTPCT in the last 72 hours. Urine analysis: No results found for: COLORURINE, APPEARANCEUR, LABSPEC, PHURINE, GLUCOSEU, HGBUR, BILIRUBINUR, KETONESUR, PROTEINUR, UROBILINOGEN, NITRITE, LEUKOCYTESUR   Rory Montel M.D. [facility] 09/30/2017, 1:22 PM  Pager: [phone] Between 7am to 7pm - call Pager - [phone]  After 7pm go to [email or web address] - password [email or web address]  Call night coverage person covering after 7pm

## 2017-09-30 NOTE — Evaluation (Addendum)
Physical Therapy Evaluation Patient Details Name: Todd Coleman MRN: [id number] DOB: 02/11/1941 Today's Date: 09/30/2017   History of Present Illness  Todd Coleman is a 77 y.o. male with medical history significant of CAD, ESRD on HD MWF, HTN, HLD, DM2, Barrett's esophagus. Pt with Bilateral amputations.  (below the knee). Pt fell out of WC this week.  Pt with broken L femur.  Pt had ORIF 4/9   Clinical Impression  Pt's independence and functional mobility are currently limited by pain. Pt was a two person assist transfer OOB to recliner chair.  Further assessment is needed to determine how much pt's current condition is affecting his functional mobility. Todd Coleman reports using sliding boards for transfer to and from his electric WC in the home setting. While he has bilat prostheses at home, he has not worn them in 2-3 weeks and is unable to use them at the moment due to pain/swelling. Pt would benefit from skilled physical therapy to improve his balance, independence and mobility with transfers, and residual limb management while fracture is healing.    Follow Up Recommendations SNF    Equipment Recommendations  None recommended by PT    Recommendations for Other Services   NA    Precautions / Restrictions Precautions Precautions: Fall Restrictions Weight Bearing Restrictions: Yes LLE Weight Bearing: Non weight bearing      Mobility  Bed Mobility Overal bed mobility: (further assessment pending next session) Bed Mobility: (further assessment pending next session)           General bed mobility comments: Pt would likely need assist for transitions, but when PT entered the room he was in a modified long sitting (almost circle sitting like) in the bed with HOB elevated and trunk off of the bed.   Transfers Overall transfer level: Needs assistance(further assessment needed pending WC)   Transfers: Licensed conveyancerAnterior-Posterior Transfer       Anterior-Posterior transfers: +2 physical  assistance;Max assist;Mod assist(see general transfer comment)   General transfer comment: Directed pt to transfer from bed to recliner by scooting in anterior to posterior direction. Pt was Max A +2; Max A at the pelvis for scooting, and Mod A at the shoulder for guarding and maintaining trunk balance in static and dynamic sitting. Pt able to use arm rests of recliner to scoot the rest of the way into the recliner with Min A on L pelvis side chuck pad to assist.        Merchant navy officerWheelchair Mobility Wheelchair Mobility Wheelchair mobility: Yes Wheelchair propulsion: (further assessment needed pending WC) Wheelchair parts: (further assessment needed pending WC)         Balance Overall balance assessment: Needs assistance Sitting-balance support: No upper extremity supported;Bilateral upper extremity supported(bilat residual limbs supported) Sitting balance-Leahy Scale: Poor(Difficulties maintaining dynamic sitting balance) Sitting balance - Comments: Pt is Mod A +1 for dynamic sitting balance secondary to frequent loss of balance and difficulty righting himself.                                     Pertinent Vitals/Pain Pain Assessment: Faces Pain Score: 0-No pain(at rest) Faces Pain Scale: Hurts whole lot(when transfering bed to recliner) Pain Location: left leg Pain Descriptors / Indicators: Aching;Burning;Grimacing;Guarding Pain Intervention(s): Limited activity within patient's tolerance;Monitored during session;Repositioned    Home Living Family/patient expects to be discharged to:: Skilled nursing facility([facility]) Living Arrangements: Alone(assisted living) Available Help at Discharge: Other (Comment)(assisted living residence  but pt was previously independent) Type of Home: Independent living facility       Home Layout: One level Home Equipment: Tub bench;Wheelchair - Engineer, technical sales - power;Hospital bed;Grab bars - toilet;Grab bars - tub/shower;Hand held  shower head;Adaptive equipment(pt has BLE prosthetics and sliding boards at home ) Additional Comments: Pt reports he hasn't used BLE prostheses in 2-3 weeks but usually uses them. Pt usually transfers bed to electric WC using sliding board.    Prior Function Level of Independence: Independent with assistive device(s)(assistive devices for transfers include sliding boards)         Comments: Pt uses slide board to transfer to his electric WC, puts bil prosthesis on and then goes to [employer] every day.       Hand Dominance   Dominant Hand: Right    Extremity/Trunk Assessment   Upper Extremity Assessment Upper Extremity Assessment: Defer to OT evaluation    Lower Extremity Assessment Lower Extremity Assessment: RLE deficits/detail;LLE deficits/detail(Bilat LEs mostly in knee flexion and hip flexion) RLE Deficits / Details: decreased gross strength, gross ROM RLE Sensation: (assessment pending next session) RLE Coordination: decreased gross motor(further assessment pending next session) LLE Deficits / Details: pain, decreased gross strength, decreased gross ROM LLE: Unable to fully assess due to pain LLE Sensation: (assessment pending next assessment) LLE Coordination: decreased gross motor(further assessment pending next session)    Cervical / Trunk Assessment Cervical / Trunk Assessment: Normal  Communication   Communication: No difficulties  Cognition Arousal/Alertness: Awake/alert Behavior During Therapy: WFL for tasks assessed/performed Overall Cognitive Status: Within Functional Limits for tasks assessed                                               Assessment/Plan    PT Assessment Patient needs continued PT services  PT Problem List Decreased strength;Decreased range of motion;Decreased activity tolerance;Decreased balance;Decreased mobility;Pain       PT Treatment Interventions DME instruction;Functional mobility training;Therapeutic  activities;Therapeutic exercise;Balance training;Patient/family education    PT Goals (Current goals can be found in the Care Plan section)  Acute Rehab PT Goals Patient Stated Goal: go to SNF for rehab before returning to assisted living PT Goal Formulation: With patient Potential to Achieve Goals: Good    Frequency Min 3X/week           AM-PAC PT "6 Clicks" Daily Activity  Outcome Measure Difficulty turning over in bed (including adjusting bedclothes, sheets and blankets)?: Unable Difficulty moving from lying on back to sitting on the side of the bed? : Unable Difficulty sitting down on and standing up from a chair with arms (e.g., wheelchair, bedside commode, etc,.)?: Unable Help needed moving to and from a bed to chair (including a wheelchair)?: A Lot Help needed walking in hospital room?: Total Help needed climbing 3-5 steps with a railing? : Total 6 Click Score: 7    End of Session   Activity Tolerance: Patient limited by pain;Patient limited by fatigue Patient left: in chair;with call bell/phone within reach;with chair alarm set Nurse Communication: Need for lift equipment PT Visit Diagnosis: Muscle weakness (generalized) (M62.81);Pain;Difficulty in walking, not elsewhere classified (R26.2) Pain - Right/Left: Left Pain - part of body: Leg    Time: 1610-9604 PT Time Calculation (min) (ACUTE ONLY): 37 min   Charges:          Lurena Joiner B. Malike Foglio, PT, DPT [phone]  PT Evaluation $PT Eval Moderate Complexity: 1 Mod PT Treatments $Therapeutic Activity: 8-22 mins   09/30/2017, 5:44 PM

## 2017-09-30 NOTE — Progress Notes (Signed)
Occupational Therapy Treatment Patient Details Name: Todd Coleman MRN: [id number] DOB: March 14, 1941 Today's Date: 09/30/2017    History of present illness Todd Coleman is a 77 y.o. male with medical history significant of CAD, ESRD on HD MWF, HTN, HLD, DM2, Barrett's esophagus. Pt with Bilateral amputations.  (below the knee). Pt fell out of WC this week.  Pt with broken L femur.  Pt had ORIF 4/9    OT comments  Pt progressing towards acute OT goals. Focus of session was pre-transfer, bed level tasks.  Pt with difficulty initiating scooting in all directions "I can't get this (left) leg to move." Practiced lateral. Max A to scoot up minimally in bed.  D/c plan to SNF for rehab remains appropriate.    Follow Up Recommendations  SNF    Equipment Recommendations  None recommended by OT    Recommendations for Other Services      Precautions / Restrictions Precautions Precautions: Fall Restrictions Weight Bearing Restrictions: Yes LLE Weight Bearing: Non weight bearing       Mobility Bed Mobility               General bed mobility comments: Pt with difficulty initiating scooting in all directions "I can't get this (left) leg to move." Instructed in lateral leaning exercises. Max A to scoot up minimally in bed.  Transfers                 General transfer comment: Would be a +2 for transfers. Discuss a-p transfer technique since no drop-arm 3n1 or recliner available.     Balance                                           ADL either performed or assessed with clinical judgement   ADL Overall ADL's : Needs assistance/impaired                                       General ADL Comments: Pre-transfer, bed level tasks.      Vision       Perception     Praxis      Cognition Arousal/Alertness: Awake/alert Behavior During Therapy: WFL for tasks assessed/performed;Flat affect Overall Cognitive Status: Within Functional Limits  for tasks assessed                                          Exercises     Shoulder Instructions       General Comments      Pertinent Vitals/ Pain       Pain Assessment: Faces Faces Pain Scale: Hurts even more Pain Location: L femur area- spasms Pain Descriptors / Indicators: Shooting;Spasm Pain Intervention(s): Limited activity within patient's tolerance;Monitored during session  Home Living                                          Prior Functioning/Environment              Frequency  Min 2X/week        Progress Toward Goals  OT Goals(current goals can now be found in  the care plan section)  Progress towards OT goals: Progressing toward goals  Acute Rehab OT Goals Patient Stated Goal: get home with brother OT Goal Formulation: With patient Time For Goal Achievement: 10/13/17 Potential to Achieve Goals: Good ADL Goals Pt Will Perform Lower Body Bathing: with min assist;sitting/lateral leans Pt Will Perform Lower Body Dressing: with min assist;sitting/lateral leans Pt Will Transfer to Toilet: with min assist;bedside commode;with transfer board Pt Will Perform Toileting - Clothing Manipulation and hygiene: with min assist;sitting/lateral leans  Plan Discharge plan remains appropriate    Co-evaluation                 AM-PAC PT "6 Clicks" Daily Activity     Outcome Measure   Help from another person eating meals?: None Help from another person taking care of personal grooming?: A Little Help from another person toileting, which includes using toliet, bedpan, or urinal?: Total Help from another person bathing (including washing, rinsing, drying)?: A Lot Help from another person to put on and taking off regular upper body clothing?: A Little Help from another person to put on and taking off regular lower body clothing?: Total 6 Click Score: 14    End of Session    OT Visit Diagnosis: History of falling  (Z91.81);Pain Pain - Right/Left: Left Pain - part of body: Leg   Activity Tolerance Patient limited by pain;Patient limited by fatigue   Patient Left in bed   Nurse Communication          Time: [id number] OT Time Calculation (min): 9 min  Charges: OT General Charges $OT Visit: 1 Visit OT Treatments $Self Care/Home Management : 8-22 mins     Todd Coleman, Khaila Velarde H 09/30/2017, 12:17 PM

## 2017-10-01 DIAGNOSIS — S72402A Unspecified fracture of lower end of left femur, initial encounter for closed fracture: Principal | ICD-10-CM

## 2017-10-01 DIAGNOSIS — S728X2A Other fracture of left femur, initial encounter for closed fracture: Secondary | ICD-10-CM

## 2017-10-01 LAB — RENAL FUNCTION PANEL
Albumin: 2.5 g/dL — ABNORMAL LOW (ref 3.5–5.0)
Anion gap: 18 — ABNORMAL HIGH (ref 5–15)
BUN: 45 mg/dL — AB (ref 6–20)
CHLORIDE: 90 mmol/L — AB (ref 101–111)
CO2: 24 mmol/L (ref 22–32)
Calcium: 8.9 mg/dL (ref 8.9–10.3)
Creatinine, Ser: 8.26 mg/dL — ABNORMAL HIGH (ref 0.61–1.24)
GFR calc Af Amer: 7 mL/min — ABNORMAL LOW (ref >60)
GFR, EST NON AFRICAN AMERICAN: 6 mL/min — AB (ref >60)
Glucose, Bld: 116 mg/dL — ABNORMAL HIGH (ref 65–99)
POTASSIUM: 4.2 mmol/L (ref 3.5–5.1)
Phosphorus: 7.7 mg/dL — ABNORMAL HIGH (ref 2.5–4.6)
Sodium: 132 mmol/L — ABNORMAL LOW (ref 135–145)

## 2017-10-01 LAB — CBC
HCT: 23.4 % — ABNORMAL LOW (ref 39.0–52.0)
Hemoglobin: 7.8 g/dL — ABNORMAL LOW (ref 13.0–17.0)
MCH: 33.8 pg (ref 26.0–34.0)
MCHC: 33.3 g/dL (ref 30.0–36.0)
MCV: 101.3 fL — ABNORMAL HIGH (ref 78.0–100.0)
PLATELETS: 208 10*3/uL (ref 150–400)
RBC: 2.31 MIL/uL — ABNORMAL LOW (ref 4.22–5.81)
RDW: 14.5 % (ref 11.5–15.5)
WBC: 5.7 10*3/uL (ref 4.0–10.5)

## 2017-10-01 LAB — GLUCOSE, CAPILLARY
GLUCOSE-CAPILLARY: 118 mg/dL — AB (ref 65–99)
GLUCOSE-CAPILLARY: 109 mg/dL — AB (ref 65–99)
GLUCOSE-CAPILLARY: 129 mg/dL — AB (ref 65–99)
GLUCOSE-CAPILLARY: 118 mg/dL — AB (ref 65–99)
Glucose-Capillary: 135 mg/dL — ABNORMAL HIGH (ref 65–99)

## 2017-10-01 LAB — HEPATITIS B SURFACE ANTIGEN: HEP B S AG: NEGATIVE

## 2017-10-01 MED ORDER — DARBEPOETIN ALFA 150 MCG/0.3ML IJ SOSY
150.0000 ug | PREFILLED_SYRINGE | INTRAMUSCULAR | Status: DC
  Administered 2017-10-01: 150 ug via INTRAVENOUS

## 2017-10-01 MED ORDER — DARBEPOETIN ALFA 150 MCG/0.3ML IJ SOSY
PREFILLED_SYRINGE | INTRAMUSCULAR | Status: AC
  Filled 2017-10-01: qty 0.3

## 2017-10-01 NOTE — Progress Notes (Signed)
PROGRESS NOTE    Luberta RobertsonDaniel Vanderkolk  WUJ:[id number] DOB: September 18, 1940 DOA: 09/27/2017 PCP: Romie JumperBarden, Lucy P, PA-C    Brief Narrative:  77 year old male who presented after a mechanical fall.  Patient does have a significant past medical history of coronary artery disease, end-stage renal disease on hemodialysis, hypertension, dyslipidemia, type 2 diabetes mellitus and Barrett's esophagus.  Patient fell from his motorized chair landing on his left knee and face.  Developing significant pain post trauma.  He had a recent hospitalization for volume overload, required hemodialysis.  On his initial physical examination blood pressure 137/66, heart rate 93, respiration 19, temperature 97.4, saturation 100%.  His mucous membranes, lungs are clear to auscultation bilaterally, heart S1-S2 present and rhythmic, abdomen soft nontender, no lower extremity edema.  Noted shortening of the left lower extremity.  Sodium 133, potassium 4.4, chloride 91, bicarb 25, glucose 243, BUN 26, creatinine 6.28, white count 9.9, hemoglobin 12.0, hematocrit 36.4, platelets 230.  Chest x-ray, hypoinflated, no infiltrates.  Left lower extremity films with acute distal femur fracture.  EKG sinus rhythm, normal axis, normal intervals.  Patient was admitted to the hospital working diagnosis of acute distal femur fracture.   Assessment & Plan:   Principal Problem:   Closed left femoral fracture (HCC) Active Problems:   ESRD (end stage renal disease) (HCC)   S/P bilateral BKA (below knee amputation) (HCC)   HTN (hypertension)   DM (diabetes mellitus), type 2 with renal complications (HCC)   CAD (coronary artery disease)   PAD (peripheral artery disease) (HCC)   Femoral fracture (HCC)   Prolonged QT interval  1.  Acute distal femur fracture. Will continue pain control with as needed fentanyl IV and oxycodone, dc morphine. Continue dvt prophylaxis, continue to follow orthopedic surgery recommendations. Will need snf at discharge, social  services consulted.   2.  End-stage renal disease on hemodialysis. Clinically euvolemic, will continue HD per home schedule. K at 4.2 and serum bicarbonate at 24. Continue sevelamer,   3.  Hypertension. Will continue blood pressure control with   4.  Type 2 diabetes mellitus. Continue insulin sliding scale for glucose cover and monitoring. Patient is tolerating po well. Basal insulin with glargine 15 units daily at bedtime, capillary glucose   5. Coronary artery disease. Will continue antiplatelet therapy with aspirin and clopidogrel.   6. Anemia of chronic renal disease. Hb at 7,8 with Hct at 23, will continue close monitoring, holding on prbc transfusion for now. Continue Aranesp.   DVT prophylaxis: heparin   Code Status:  full Family Communication: no family at the bedside  Disposition Plan: snf when bed available.    Consultants:   Orthopedics  Nephrology   Procedures:     Antimicrobials:       Subjective: Patient feeling well better, pain is well controlled, no nausea or vomiting, tolerating hd well. Patient lives at a nursing facility.   Objective: Vitals:   10/01/17 1015 10/01/17 1100 10/01/17 1128 10/01/17 1220  BP: (!) 133/53 (!) 100/40 (!) 116/59 135/78  Pulse: 72 73 73   Resp: 17 18 18 16   Temp:   98 F (36.7 C) 98.5 F (36.9 C)  TempSrc:   Oral Oral  SpO2:   97% 94%  Weight:   83 kg (182 lb 15.7 oz)   Height:        Intake/Output Summary (Last 24 hours) at 10/01/2017 1238 Last data filed at 10/01/2017 1128 Gross per 24 hour  Intake -  Output 1047 ml  Net -1047  ml   Filed Weights   09/29/17 1555 10/01/17 0715 10/01/17 1128  Weight: 87 kg (191 lb 12.8 oz) 84 kg (185 lb 3 oz) 83 kg (182 lb 15.7 oz)    Examination:   General: Not in pain or dyspnea, deconditioned Neurology: Awake and alert, non focal  E ENT: mild pallor, no icterus, oral mucosa moist Cardiovascular: No JVD. S1-S2 present, rhythmic, no gallops, rubs, or murmurs. No lower  extremity edema. Pulmonary: vesicular breath sounds bilaterally, adequate air movement, no wheezing, rhonchi or rales. Gastrointestinal. Abdomen with no organomegaly, non tender, no rebound or guarding Skin. No rashes Musculoskeletal: below the knee amputation, bilaterally. Surgical wound on the left with dressing in place.      Data Reviewed: I have personally reviewed following labs and imaging studies  CBC: Recent Labs  Lab 09/27/17 2123 09/29/17 0655 09/30/17 0702 09/30/17 2212 10/01/17 0638  WBC 8.9 8.7 9.3 6.2 5.7  NEUTROABS 7.4  --   --   --   --   HGB 12.0* 9.4* 9.0* 8.0* 7.8*  HCT 36.4* 28.6* 27.3* 24.0* 23.4*  MCV 101.1* 102.9* 103.8* 101.3* 101.3*  PLT 230 193 217 199 208   Basic Metabolic Panel: Recent Labs  Lab 09/27/17 2123 09/29/17 0655 09/30/17 0702 09/30/17 2212 10/01/17 0638  NA 133* 135 134* 132* 132*  K 4.4 4.8 4.2 4.1 4.2  CL 91* 92* 90* 89* 90*  CO2 25 23 26 26 24   GLUCOSE 243* 146* 164* 151* 116*  BUN 26* 44* 24* 36* 45*  CREATININE 6.28* 9.69* 6.09* 7.22* 8.26*  CALCIUM 9.2 9.3 9.3 9.1 8.9  PHOS  --   --   --  7.1* 7.7*   GFR: Estimated Creatinine Clearance: 7.8 mL/min (A) (by C-G formula based on SCr of 8.26 mg/dL (H)). Liver Function Tests: Recent Labs  Lab 09/27/17 2123 09/29/17 0655 09/30/17 2212 10/01/17 0638  AST 45* 48*  --   --   ALT 20 16*  --   --   ALKPHOS 53 45  --   --   BILITOT 1.0 1.0  --   --   PROT 7.3 6.4*  --   --   ALBUMIN 3.5 2.9* 2.4* 2.5*   No results for input(s): LIPASE, AMYLASE in the last 168 hours. No results for input(s): AMMONIA in the last 168 hours. Coagulation Profile: No results for input(s): INR, PROTIME in the last 168 hours. Cardiac Enzymes: No results for input(s): CKTOTAL, CKMB, CKMBINDEX, TROPONINI in the last 168 hours. BNP (last 3 results) No results for input(s): PROBNP in the last 8760 hours. HbA1C: No results for input(s): HGBA1C in the last 72 hours. CBG: Recent Labs  Lab  09/30/17 0813 09/30/17 1127 09/30/17 1629 09/30/17 2239 10/01/17 0647  GLUCAP 165* 168* 220* 135* 118*   Lipid Profile: No results for input(s): CHOL, HDL, LDLCALC, TRIG, CHOLHDL, LDLDIRECT in the last 72 hours. Thyroid Function Tests: No results for input(s): TSH, T4TOTAL, FREET4, T3FREE, THYROIDAB in the last 72 hours. Anemia Panel: No results for input(s): VITAMINB12, FOLATE, FERRITIN, TIBC, IRON, RETICCTPCT in the last 72 hours.    Radiology Studies: I have reviewed all of the imaging during this hospital visit personally     Scheduled Meds: . aspirin  325 mg Oral Daily  . atorvastatin  80 mg Oral QPM  . Chlorhexidine Gluconate Cloth  6 each Topical Q0600  . clopidogrel  75 mg Oral QPM  . Darbepoetin Alfa      . darbepoetin (ARANESP)  injection - DIALYSIS  150 mcg Intravenous Q Fri-HD  . docusate sodium  100 mg Oral BID  . doxercalciferol  3 mcg Intravenous Q M,W,F-HD  . feeding supplement (NEPRO CARB STEADY)  237 mL Oral BID BM  . heparin injection (subcutaneous)  5,000 Units Subcutaneous Q8H  . insulin aspart  0-5 Units Subcutaneous QHS  . insulin aspart  0-9 Units Subcutaneous TID WC  . insulin glargine  15 Units Subcutaneous QHS  . lactulose  30 g Oral Once  . multivitamin  1 tablet Oral QHS  . mupirocin ointment  1 application Nasal BID  . sevelamer carbonate  2,400 mg Oral TID WC   Continuous Infusions: . methocarbamol (ROBAXIN)  IV       LOS: 4 days        Mauricio Annett Gula, MD [facility] Pager [phone]

## 2017-10-01 NOTE — Progress Notes (Signed)
PT Cancellation Note  Patient Details Name: Todd RobertsonDaniel Coleman MRN: [id number] DOB: Oct 10, 1940   Cancelled Treatment:    Reason Eval/Treat Not Completed: Patient at procedure or test/unavailable. Off floor for HD. Will follow up for PT treatment as schedule permits.  [name], PT, DPT Acute Rehab Services  Pager: [phone]  [name] 10/01/2017, 8:45 AM

## 2017-10-01 NOTE — Procedures (Signed)
Patient was seen on dialysis and the procedure was supervised.  BFR 400  Via AVF BP is  132/62.   Patient appears to be tolerating treatment well  Dietrick Barris A 10/01/2017

## 2017-10-01 NOTE — Social Work (Addendum)
Pt indicated that he was interested in [facility] SNF in [place]. CSW left message for admission staff as patient indicated that he would like to go there for rehabilitative therapies.  Sister involved in care and needs to know where he will go as she assist with transport [facility].  CSW will keep her posted on placement.  CSW also discussed SNF bed offer from [facility] in [place] will continue to follow up.  Keene BreathPatricia Annasophia Crocker, LCSW Clinical Social Worker [phone]

## 2017-10-01 NOTE — Clinical Social Work Note (Addendum)
Clinical Social Work Assessment  Patient Details  Name: Todd Coleman MRN: [id number] Date of Birth: 1941/01/19  Date of referral:  10/01/17               Reason for consult:  Facility Placement                Permission sought to share information with:  Facility Art therapist granted to share information::  Yes, Verbal Permission Granted  Name::        Agency::  SNF  Relationship::     Contact Information:     Housing/Transportation Living arrangements for the past 2 months:  Single Family Home Source of Information:  Patient Patient Interpreter Needed:  None Criminal Activity/Legal Involvement Pertinent to Current Situation/Hospitalization:  No - Comment as needed Significant Relationships:  Other Family Members, Siblings Lives with:  Siblings Do you feel safe going back to the place where you live?  No Need for family participation in patient care:  No (Coment)  Care giving concerns:   Pt with new impairment and will need skilled nursing at discharge.  Social Worker assessment / plan:  CSW met with patient at bedside to discuss SNF placement and options. Pt indicated that he has experience with SNF and desires to go to [facility] in [place] for short term rehab. CSW will f/u on SNF to see if they can offer a SNF bed. CSW obtained permission to send to SNF's in the area near [place] home and [place]. Pt agreeable to short term rehab before returning home.  CSW will f/u for disposition.  Employment status:  Retired Forensic scientist:  Medicare PT Recommendations:  [place] / Referral to community resources:  [place]  Patient/Family's Response to care:  Patient thanked CSW for meeting to discuss SNF placement. Pt agreeable to SNF. No issues or concerns.  Patient/Family's Understanding of and Emotional Response to Diagnosis, Current Treatment, and Prognosis:  Pt has good understanding of diagnosis and  physical limitations and is agreeable to short term rehab. Pt resides with family and will return home once rehabilitative therapy has been complete. Pt hopeful that he will improve. CSW will continue to follow for disposition.  Emotional Assessment Appearance:  Appears stated age Attitude/Demeanor/Rapport:  (Cooperative) Affect (typically observed):  Accepting, Appropriate Orientation:  Oriented to Self, Oriented to Place, Oriented to Situation, Oriented to  Time Alcohol / Substance use:  Not Applicable Psych involvement (Current and /or in the community):  Yes (Comment)  Discharge Needs  Concerns to be addressed:  Discharge Planning Concerns Readmission within the last 30 days:  No Current discharge risk:  Dependent with Mobility, Physical Impairment Barriers to Discharge:  No Barriers Identified   Normajean Baxter, LCSW 10/01/2017, 12:59 PM

## 2017-10-01 NOTE — Progress Notes (Signed)
[facility] Progress Note   Subjective:   Feeling better today.  Pain well controlled. Dispo is up in the air- seen on HD  Objective Vitals:   09/30/17 1510 09/30/17 2241 10/01/17 0652 10/01/17 0728  BP: 130/61 (!) 106/42 109/60 132/62  Pulse: 88 94 91 89  Resp: 16 16 16 15   Temp: 99 F (37.2 C) 99.6 F (37.6 C) 99.9 F (37.7 C)   TempSrc: Oral Oral Oral   SpO2: 98% 92% 99%   Weight:      Height:       Physical Exam General:NAD, chronically ill appearing male Heart:RRR, 2/6 systolic murmur, no rub or gallop Lungs:CTAB anterior/laterally, nml WOB Abdomen:soft, NTND, +BS Extremities:b/l BKA, L wrapped, R in sock.  No edema Dialysis Access: LU AVF +b/t   Filed Weights   09/27/17 1835 09/29/17 1555  Weight: 87.1 kg (192 lb) 87 kg (191 lb 12.8 oz)   No intake or output data in the 24 hours ending 10/01/17 0857  Additional Objective Labs: Basic Metabolic Panel: Recent Labs  Lab 09/30/17 0702 09/30/17 2212 10/01/17 0638  NA 134* 132* 132*  K 4.2 4.1 4.2  CL 90* 89* 90*  CO2 26 26 24   GLUCOSE 164* 151* 116*  BUN 24* 36* 45*  CREATININE 6.09* 7.22* 8.26*  CALCIUM 9.3 9.1 8.9  PHOS  --  7.1* 7.7*   Liver Function Tests: Recent Labs  Lab 09/27/17 2123 09/29/17 0655 09/30/17 2212 10/01/17 0638  AST 45* 48*  --   --   ALT 20 16*  --   --   ALKPHOS 53 45  --   --   BILITOT 1.0 1.0  --   --   PROT 7.3 6.4*  --   --   ALBUMIN 3.5 2.9* 2.4* 2.5*   CBC: Recent Labs  Lab 09/27/17 2123 09/29/17 0655 09/30/17 0702 09/30/17 2212 10/01/17 0638  WBC 8.9 8.7 9.3 6.2 5.7  NEUTROABS 7.4  --   --   --   --   HGB 12.0* 9.4* 9.0* 8.0* 7.8*  HCT 36.4* 28.6* 27.3* 24.0* 23.4*  MCV 101.1* 102.9* 103.8* 101.3* 101.3*  PLT 230 193 217 199 208   CBG: Recent Labs  Lab 09/30/17 0813 09/30/17 1127 09/30/17 1629 09/30/17 2239 10/01/17 0647  GLUCAP 165* 168* 220* 135* 118*   Studies/Results: No results found.  Medications: . sodium chloride    .  sodium chloride    . methocarbamol (ROBAXIN)  IV     . aspirin  325 mg Oral Daily  . atorvastatin  80 mg Oral QPM  . Chlorhexidine Gluconate Cloth  6 each Topical Q0600  . clopidogrel  75 mg Oral QPM  . docusate sodium  100 mg Oral BID  . doxercalciferol  3 mcg Intravenous Q M,W,F-HD  . feeding supplement (NEPRO CARB STEADY)  237 mL Oral BID BM  . heparin injection (subcutaneous)  5,000 Units Subcutaneous Q8H  . insulin aspart  0-5 Units Subcutaneous QHS  . insulin aspart  0-9 Units Subcutaneous TID WC  . insulin glargine  15 Units Subcutaneous QHS  . lactulose  30 g Oral Once  . multivitamin  1 tablet Oral QHS  . mupirocin ointment  1 application Nasal BID  . sevelamer carbonate  2,400 mg Oral TID WC    Dialysis Orders: MWF - [facility]  4hrs, BFR 400, DFR 800,  EDW 82.5kg, 2K/ 2.5Ca, Linear Na   Access: LU AVF  Heparin 5000 Unit bolus Hectorol  3mcg qHD   Assessment/Plan: 1. L distal femur fracture - b/l BKA, s/p ORIF on 4/9 Dr. Eulah PontMurphy. Pain well controlled.  D/c to SNF likely not sure when.  Per primary.   2.  ESRD -  Continue HD per regular MWF schedule via AVF while admitted. K 4.2.  3.  Hypotension/volume  - Bp mostly low.  No meds.  Appears euvolemic on exam. Titrate down as tolerated.  4.  Anemia  - Hgb 9.4>9.0 post surgery, now 7.8. Follow trend. No ESA as OP- will add today, no IV iron due to allergy.  5.  Secondary Hyperparathyroidism -  CCa 10.2. No P. Continue VDRA and binders. Changed to 2K/2Ca bath d/t borderline high Ca. Follow trend- coming down 6.  Nutrition - Alb 2.9. Renal diet with fluid restrictions. Nepro.  7. DMT2 - on insulin, per primary 8. CAD - cardio consulted for pre op clearance, now signed off. Per primary   Todd Coleman A  10/01/2017,8:57 AM  LOS: 4 days

## 2017-10-02 ENCOUNTER — Other Ambulatory Visit: Payer: Self-pay

## 2017-10-02 DIAGNOSIS — Z89512 Acquired absence of left leg below knee: Secondary | ICD-10-CM

## 2017-10-02 DIAGNOSIS — I10 Essential (primary) hypertension: Secondary | ICD-10-CM

## 2017-10-02 DIAGNOSIS — Z89511 Acquired absence of right leg below knee: Secondary | ICD-10-CM

## 2017-10-02 LAB — BASIC METABOLIC PANEL
Anion gap: 14 (ref 5–15)
BUN: 27 mg/dL — ABNORMAL HIGH (ref 6–20)
CALCIUM: 8.9 mg/dL (ref 8.9–10.3)
CHLORIDE: 96 mmol/L — AB (ref 101–111)
CO2: 27 mmol/L (ref 22–32)
CREATININE: 5.16 mg/dL — AB (ref 0.61–1.24)
GFR, EST AFRICAN AMERICAN: 11 mL/min — AB (ref >60)
GFR, EST NON AFRICAN AMERICAN: 10 mL/min — AB (ref >60)
Glucose, Bld: 82 mg/dL (ref 65–99)
Potassium: 3.7 mmol/L (ref 3.5–5.1)
SODIUM: 137 mmol/L (ref 135–145)

## 2017-10-02 LAB — GLUCOSE, CAPILLARY
GLUCOSE-CAPILLARY: 108 mg/dL — AB (ref 65–99)
Glucose-Capillary: 90 mg/dL (ref 65–99)
Glucose-Capillary: 89 mg/dL (ref 65–99)
Glucose-Capillary: 117 mg/dL — ABNORMAL HIGH (ref 65–99)

## 2017-10-02 MED ORDER — INSULIN GLARGINE 100 UNIT/ML ~~LOC~~ SOLN
10.0000 [IU] | Freq: Every day | SUBCUTANEOUS | Status: DC
  Administered 2017-10-02 – 2017-10-03 (×2): 10 [IU] via SUBCUTANEOUS
  Filled 2017-10-02 (×3): qty 0.1

## 2017-10-02 MED ORDER — POLYETHYLENE GLYCOL 3350 17 G PO PACK: 17.0000 g | PACK | Freq: Every day | ORAL | Status: DC

## 2017-10-02 MED ORDER — METOPROLOL TARTRATE 25 MG PO TABS
25.0000 mg | ORAL_TABLET | Freq: Two times a day (BID) | ORAL | Status: DC
  Administered 2017-10-02 – 2017-10-03 (×3): 25 mg via ORAL
  Filled 2017-10-02 (×4): qty 1

## 2017-10-02 MED ORDER — POLYETHYLENE GLYCOL 3350 17 G PO PACK
17.0000 g | PACK | Freq: Every day | ORAL | Status: DC
  Administered 2017-10-04: 17 g via ORAL
  Filled 2017-10-02 (×4): qty 1

## 2017-10-02 NOTE — Progress Notes (Signed)
[facility] Progress Note   Subjective:   Overall pain well controlled. SW looking for SNF placement, states he may need assistance with transportation for dialysis.  Objective Vitals:   10/01/17 1220 10/01/17 2333 10/02/17 0600 10/02/17 0849  BP: 135/78 (!) 133/45 (!) 165/56 (!) 128/47  Pulse:  91 (!) 52 80  Resp: 16     Temp: 98.5 F (36.9 C) 98.7 F (37.1 C) 98.7 F (37.1 C) 98.8 F (37.1 C)  TempSrc: Oral Oral Oral Oral  SpO2: 94% 93% 100% 99%  Weight:      Height:       Physical Exam General:NAD, chronically ill appearing male Heart:RRR, +2/6 systolic murmur @ LSB, no rub or gallop Lungs:CTAB anterior/laterally, nml WOB  Abdomen:soft, NTND, +BS Extremities:b/l BKS, L wrapped +non pitting edema, R in sock no edema. Dialysis Access: LU AVF +b/t   Filed Weights   09/29/17 1555 10/01/17 0715 10/01/17 1128  Weight: 87 kg (191 lb 12.8 oz) 84 kg (185 lb 3 oz) 83 kg (182 lb 15.7 oz)    Intake/Output Summary (Last 24 hours) at 10/02/2017 0929 Last data filed at 10/01/2017 1128 Gross per 24 hour  Intake -  Output 1047 ml  Net -1047 ml    Additional Objective Labs: Basic Metabolic Panel: Recent Labs  Lab 09/30/17 2212 10/01/17 0638 10/02/17 0519  NA 132* 132* 137  K 4.1 4.2 3.7  CL 89* 90* 96*  CO2 26 24 27   GLUCOSE 151* 116* 82  BUN 36* 45* 27*  CREATININE 7.22* 8.26* 5.16*  CALCIUM 9.1 8.9 8.9  PHOS 7.1* 7.7*  --    Liver Function Tests: Recent Labs  Lab 09/27/17 2123 09/29/17 0655 09/30/17 2212 10/01/17 0638  AST 45* 48*  --   --   ALT 20 16*  --   --   ALKPHOS 53 45  --   --   BILITOT 1.0 1.0  --   --   PROT 7.3 6.4*  --   --   ALBUMIN 3.5 2.9* 2.4* 2.5*   CBC: Recent Labs  Lab 09/27/17 2123 09/29/17 0655 09/30/17 0702 09/30/17 2212 10/01/17 0638  WBC 8.9 8.7 9.3 6.2 5.7  NEUTROABS 7.4  --   --   --   --   HGB 12.0* 9.4* 9.0* 8.0* 7.8*  HCT 36.4* 28.6* 27.3* 24.0* 23.4*  MCV 101.1* 102.9* 103.8* 101.3* 101.3*  PLT 230  193 217 199 208   CBG: Recent Labs  Lab 10/01/17 0647 10/01/17 1245 10/01/17 1648 10/01/17 2143 10/02/17 0603  GLUCAP 118* 109* 129* 118* 90    Medications: . methocarbamol (ROBAXIN)  IV     . aspirin  325 mg Oral Daily  . atorvastatin  80 mg Oral QPM  . Chlorhexidine Gluconate Cloth  6 each Topical Q0600  . clopidogrel  75 mg Oral QPM  . darbepoetin (ARANESP) injection - DIALYSIS  150 mcg Intravenous Q Fri-HD  . docusate sodium  100 mg Oral BID  . doxercalciferol  3 mcg Intravenous Q M,W,F-HD  . feeding supplement (NEPRO CARB STEADY)  237 mL Oral BID BM  . heparin injection (subcutaneous)  5,000 Units Subcutaneous Q8H  . insulin aspart  0-5 Units Subcutaneous QHS  . insulin aspart  0-9 Units Subcutaneous TID WC  . insulin glargine  15 Units Subcutaneous QHS  . lactulose  30 g Oral Once  . multivitamin  1 tablet Oral QHS  . mupirocin ointment  1 application Nasal BID  . sevelamer  carbonate  2,400 mg Oral TID WC    Dialysis Orders: MWF -[facility] 4hrs, BFR400, [id number], EDW 82.5kg,2K/2.5Ca,Linear Na  Access:LU AVF Heparin5000 Unit bolus Hectorol qHD   Assessment/Plan: 1. L distal femur fracture - b/l BKA, s/p ORIF on 4/9 Dr. Eulah Pont. Pain well controlled.  D/c to SNF when placed.  Per primary.  2. ESRD - Continue HD per regular MWF schedulevia AVF while admitted. K 3.7. Tolerated well yesterday ~1L removed.   3. Hypotension/volume - Bp mostly low.  No meds.  Appears euvolemic on exam. Titrate down as tolerated, close to EDW. 4. Anemia - Hgb 9.4>9.0 post surgery, now 7.8.Follow trend. No ESA as OP- started on Aranesp qwk 1st dose 4/12, no IV iron due to allergy.  5. Secondary Hyperparathyroidism - CCa 10.2. No P. Continue VDRA and binders. Changed to 2K/2Ca bath d/t borderline high Ca. Follow trend- coming down 6. Nutrition - Alb 2.5. Renal diet with fluid restrictions. Nepro.  7. DMT2 - on insulin, per primary 8. CAD - cardio  consulted for pre op clearance, now signed off. Per primary 9. Dispo - ok from renal standpoint.  Looking for SNF placement. Told me today he will assistance with transportation to HD. Need to make sure HD is considered in SNF placement, may require change in OP unit while in rehab.    Virgina Norfolk, PA-C [facility] Pager: [phone] 10/02/2017,9:29 AM  LOS: 5 days

## 2017-10-02 NOTE — Plan of Care (Signed)
  Problem: Pain Managment: Goal: General experience of comfort will improve Outcome: Progressing   Problem: Clinical Measurements: Goal: Ability to maintain clinical measurements within normal limits will improve Outcome: Progressing   

## 2017-10-02 NOTE — Progress Notes (Signed)
PROGRESS NOTE    Todd RobertsonDaniel Coleman  ZHY:[id number] DOB: [birth year] DOA: 09/27/2017 PCP: Romie JumperBarden, Lucy P, PA-C    Brief Narrative:  77 year old male who presented after a mechanical fall.  Patient does have a significant past medical history of coronary artery disease, end-stage renal disease on hemodialysis, hypertension, dyslipidemia, type 2 diabetes mellitus and Barrett's esophagus.  Patient fell from his motorized chair landing on his left knee and face.  Developing significant pain post trauma.  He had a recent hospitalization for volume overload, required hemodialysis.  On his initial physical examination blood pressure 137/66, heart rate 93, respiration 19, temperature 97.4, saturation 100%.  His mucous membranes, lungs are clear to auscultation bilaterally, heart S1-S2 present and rhythmic, abdomen soft nontender, no lower extremity edema.  Noted shortening of the left lower extremity.  Sodium 133, potassium 4.4, chloride 91, bicarb 25, glucose 243, BUN 26, creatinine 6.28, white count 9.9, hemoglobin 12.0, hematocrit 36.4, platelets 230.  Chest x-ray, hypoinflated, no infiltrates.  Left lower extremity films with acute distal femur fracture.  EKG sinus rhythm, normal axis, normal intervals.  Patient was admitted to the hospital working diagnosis of acute distal femur fracture.   Assessment & Plan:   Principal Problem:   Closed left femoral fracture (HCC) Active Problems:   ESRD (end stage renal disease) (HCC)   S/P bilateral BKA (below knee amputation) (HCC)   HTN (hypertension)   DM (diabetes mellitus), type 2 with renal complications (HCC)   CAD (coronary artery disease)   PAD (peripheral artery disease) (HCC)   Femoral fracture (HCC)   Prolonged QT interval   1.  Acute distal femur fracture.Pain continue to be well controlled with oxycodone, will dc fentanyl. DVT prophylaxis with sq heparin. Waiting placement.   2.  End-stage renal disease on hemodialysis. Tolerating well HD per  home schedule. K at 3,7 and serum bicarbonate at 27. On sevelamer. Will discontinue telemetry monitoring.   3.  Hypertension. Blood pressure systolic 120 to 160 mmHg. Will resume metoprolol 25 mg po bid, per home regimen.    4.  Type 2 diabetes mellitus. Insulin sliding scale for glucose cover and monitoring, capillary glucose 109, 129, 118, 90, 89. Will decease basal insulin to 10 from 15 to prevent hypoglycemia.   5. Coronary artery disease. On dual antiplatelet therapy with aspirin and clopidogrel.   6. Anemia of chronic renal disease. Asymptomatic, to continue Aranesp.   DVT prophylaxis: heparin   Code Status:  full Family Communication: no family at the bedside  Disposition Plan: snf when bed available.    Consultants:   Orthopedics  Nephrology   Procedures:     Antimicrobials:       Subjective: Patient feeling well, pain is controlled with analgesics, no nausea or vomiting.   Objective: Vitals:   10/01/17 2333 10/02/17 0600 10/02/17 0849 10/02/17 1337  BP: (!) 133/45 (!) 165/56 (!) 128/47 (!) 145/53  Pulse: 91 (!) 52 80 82  Resp:    18  Temp: 98.7 F (37.1 C) 98.7 F (37.1 C) 98.8 F (37.1 C) 98.3 F (36.8 C)  TempSrc: Oral Oral Oral Oral  SpO2: 93% 100% 99% 96%  Weight:      Height:        Intake/Output Summary (Last 24 hours) at 10/02/2017 1449 Last data filed at 10/02/2017 1300 Gross per 24 hour  Intake 600 ml  Output -  Net 600 ml   Filed Weights   09/29/17 1555 10/01/17 0715 10/01/17 1128  Weight: 87 kg (191  lb 12.8 oz) 84 kg (185 lb 3 oz) 83 kg (182 lb 15.7 oz)    Examination:   General: Not in pain or dyspnea Neurology: Awake and alert, non focal  E ENT: no pallor, no icterus, oral mucosa moist Cardiovascular: No JVD. S1-S2 present, rhythmic, no gallops, rubs, or murmurs. No lower extremity edema. Pulmonary: vesicular breath sounds bilaterally, adequate air movement, no wheezing, rhonchi or rales. Gastrointestinal. Abdomen  flat, no organomegaly, non tender, no rebound or guarding Skin. No rashes Musculoskeletal: BkA bilateral     Data Reviewed: I have personally reviewed following labs and imaging studies  CBC: Recent Labs  Lab 09/27/17 2123 09/29/17 0655 09/30/17 0702 09/30/17 2212 10/01/17 0638  WBC 8.9 8.7 9.3 6.2 5.7  NEUTROABS 7.4  --   --   --   --   HGB 12.0* 9.4* 9.0* 8.0* 7.8*  HCT 36.4* 28.6* 27.3* 24.0* 23.4*  MCV 101.1* 102.9* 103.8* 101.3* 101.3*  PLT 230 193 217 199 208   Basic Metabolic Panel: Recent Labs  Lab 09/29/17 0655 09/30/17 0702 09/30/17 2212 10/01/17 0638 10/02/17 0519  NA 135 134* 132* 132* 137  K 4.8 4.2 4.1 4.2 3.7  CL 92* 90* 89* 90* 96*  CO2 23 26 26 24 27   GLUCOSE 146* 164* 151* 116* 82  BUN 44* 24* 36* 45* 27*  CREATININE 9.69* 6.09* 7.22* 8.26* 5.16*  CALCIUM 9.3 9.3 9.1 8.9 8.9  PHOS  --   --  7.1* 7.7*  --    GFR: Estimated Creatinine Clearance: 12.5 mL/min (A) (by C-G formula based on SCr of 5.16 mg/dL (H)). Liver Function Tests: Recent Labs  Lab 09/27/17 2123 09/29/17 0655 09/30/17 2212 10/01/17 0638  AST 45* 48*  --   --   ALT 20 16*  --   --   ALKPHOS 53 45  --   --   BILITOT 1.0 1.0  --   --   PROT 7.3 6.4*  --   --   ALBUMIN 3.5 2.9* 2.4* 2.5*   No results for input(s): LIPASE, AMYLASE in the last 168 hours. No results for input(s): AMMONIA in the last 168 hours. Coagulation Profile: No results for input(s): INR, PROTIME in the last 168 hours. Cardiac Enzymes: No results for input(s): CKTOTAL, CKMB, CKMBINDEX, TROPONINI in the last 168 hours. BNP (last 3 results) No results for input(s): PROBNP in the last 8760 hours. HbA1C: No results for input(s): HGBA1C in the last 72 hours. CBG: Recent Labs  Lab 10/01/17 1245 10/01/17 1648 10/01/17 2143 10/02/17 0603 10/02/17 1133  GLUCAP 109* 129* 118* 90 89   Lipid Profile: No results for input(s): CHOL, HDL, LDLCALC, TRIG, CHOLHDL, LDLDIRECT in the last 72 hours. Thyroid  Function Tests: No results for input(s): TSH, T4TOTAL, FREET4, T3FREE, THYROIDAB in the last 72 hours. Anemia Panel: No results for input(s): VITAMINB12, FOLATE, FERRITIN, TIBC, IRON, RETICCTPCT in the last 72 hours.    Radiology Studies: I have reviewed all of the imaging during this hospital visit personally     Scheduled Meds: . aspirin  325 mg Oral Daily  . atorvastatin  80 mg Oral QPM  . Chlorhexidine Gluconate Cloth  6 each Topical Q0600  . clopidogrel  75 mg Oral QPM  . darbepoetin (ARANESP) injection - DIALYSIS  150 mcg Intravenous Q Fri-HD  . docusate sodium  100 mg Oral BID  . doxercalciferol  3 mcg Intravenous Q M,W,F-HD  . feeding supplement (NEPRO CARB STEADY)  237 mL Oral BID BM  .  heparin injection (subcutaneous)  5,000 Units Subcutaneous Q8H  . insulin aspart  0-5 Units Subcutaneous QHS  . insulin aspart  0-9 Units Subcutaneous TID WC  . insulin glargine  15 Units Subcutaneous QHS  . lactulose  30 g Oral Once  . multivitamin  1 tablet Oral QHS  . mupirocin ointment  1 application Nasal BID  . sevelamer carbonate  2,400 mg Oral TID WC   Continuous Infusions: . methocarbamol (ROBAXIN)  IV       LOS: 5 days        Sherelle Castelli Annett Gula, MD [facility] Pager [phone]

## 2017-10-03 LAB — GLUCOSE, CAPILLARY
GLUCOSE-CAPILLARY: 102 mg/dL — AB (ref 65–99)
GLUCOSE-CAPILLARY: 98 mg/dL (ref 65–99)
Glucose-Capillary: 128 mg/dL — ABNORMAL HIGH (ref 65–99)
Glucose-Capillary: 108 mg/dL — ABNORMAL HIGH (ref 65–99)
Glucose-Capillary: 114 mg/dL — ABNORMAL HIGH (ref 65–99)

## 2017-10-03 LAB — BASIC METABOLIC PANEL
Anion gap: 16 — ABNORMAL HIGH (ref 5–15)
BUN: 49 mg/dL — ABNORMAL HIGH (ref 6–20)
CALCIUM: 8.8 mg/dL — AB (ref 8.9–10.3)
CO2: 26 mmol/L (ref 22–32)
CREATININE: 7.98 mg/dL — AB (ref 0.61–1.24)
Chloride: 92 mmol/L — ABNORMAL LOW (ref 101–111)
GFR calc Af Amer: 7 mL/min — ABNORMAL LOW (ref >60)
GFR, EST NON AFRICAN AMERICAN: 6 mL/min — AB (ref >60)
Glucose, Bld: 122 mg/dL — ABNORMAL HIGH (ref 65–99)
Potassium: 4.4 mmol/L (ref 3.5–5.1)
SODIUM: 134 mmol/L — AB (ref 135–145)

## 2017-10-03 MED ORDER — DIPHENHYDRAMINE HCL 25 MG PO CAPS
25.0000 mg | ORAL_CAPSULE | Freq: Four times a day (QID) | ORAL | Status: DC | PRN
  Administered 2017-10-03 – 2017-10-04 (×2): 25 mg via ORAL
  Filled 2017-10-03 (×2): qty 1

## 2017-10-03 NOTE — Progress Notes (Signed)
PROGRESS NOTE    Luberta RobertsonDaniel Ambers  JYN:[id number] DOB: 1941-06-13 DOA: 09/27/2017 PCP: Romie JumperBarden, Lucy P, PA-C    Brief Narrative:  77 year old male who presented after a mechanical fall. Patient does have a significant past medical history of coronary artery disease, end-stage renal disease on hemodialysis, hypertension, dyslipidemia,type 2 diabetes mellitus and Barrett's esophagus. Patient fell from his motorized chair landing on his left knee and face.Developing significant pain post trauma. He had a recent hospitalization for volume overload, required hemodialysis. On his initial physical examination blood pressure 137/66, heart rate 93, respiration 19, temperature 97.4, saturation 100%. His mucous membranes, lungs are clear to auscultation bilaterally, heart S1-S2 present and rhythmic,abdomen soft nontender, no lower extremity edema. Noted shortening of the left lower extremity.Sodium 133, potassium 4.4, chloride 91, bicarb 25, glucose 243, BUN 26, creatinine 6.28, white count 9.9, hemoglobin 12.0, hematocrit 36.4, platelets 230.Chest x-ray, hypoinflated, no infiltrates. Left lowerextremity films with acute distal femur fracture.EKG sinus rhythm, normal axis, normal intervals.  Patient was admitted to the hospital working diagnosis of acute distal femur fracture.   Assessment & Plan:   Principal Problem:   Closed left femoral fracture (HCC) Active Problems:   ESRD (end stage renal disease) (HCC)   S/P bilateral BKA (below knee amputation) (HCC)   HTN (hypertension)   DM (diabetes mellitus), type 2 with renal complications (HCC)   CAD (coronary artery disease)   PAD (peripheral artery disease) (HCC)   Femoral fracture (HCC)   Prolonged QT interval  1.Acute distal femur fracture.Continue pain control with oxycodone with good toleration. Continue DVT prophylaxis with sq heparin.   2.End-stage renal disease on hemodialysis. Clinically euvolemic, will check K, due to  patient complains of cramps, will add benadryl as needed for pruritus. Will follow with nephrology recommendations, for HD.  3.Hypertension. On metoprolol 25 mg po bid, with good toleration.   4.Type 2 diabetes mellitus. Continue Insulin sliding scale for glucose cover and monitoring.With reduced basal insulin, capillary glucose 102, 98, 128, 108. Continue close monitoring, patient tolerating po well.  5. Coronary artery disease. Continue with dual antiplatelet therapy with aspirin - clopidogrel.   6. Anemia of chronic renal disease. Continue Aranesp.Hb at 7,8 and hct at 23 on 4/13, check cell count in am.   DVT prophylaxis:heparin Code Status:full Family Communication:no family at the bedside Disposition Plan:snf when bed available.   Consultants:  Orthopedics  Nephrology  Procedures:    Antimicrobials:     Subjective: Patient with significant cramps and pruritus, no chest pain or dyspnea, pain on surgical wound only on movement.   Objective: Vitals:   10/02/17 1337 10/02/17 2119 10/02/17 2133 10/03/17 0640  BP: (!) 145/53 (!) 119/50 (!) 138/57 (!) 126/55  Pulse: 82 93 81 80  Resp: 18 18    Temp: 98.3 F (36.8 C) 98.4 F (36.9 C)  98.6 F (37 C)  TempSrc: Oral Oral  Oral  SpO2: 96% 99%  100%  Weight:      Height:        Intake/Output Summary (Last 24 hours) at 10/03/2017 1045 Last data filed at 10/02/2017 2139 Gross per 24 hour  Intake 360 ml  Output -  Net 360 ml   Filed Weights   09/29/17 1555 10/01/17 0715 10/01/17 1128  Weight: 87 kg (191 lb 12.8 oz) 84 kg (185 lb 3 oz) 83 kg (182 lb 15.7 oz)    Examination:   General: Not in pain or dyspnea, deconditioned Neurology: Awake and alert, non focal  E ENT: mild  pallor, no icterus, oral mucosa moist Cardiovascular: No JVD. S1-S2 present, rhythmic, no gallops, rubs, or murmurs. No lower extremity edema. Pulmonary: vesicular breath sounds bilaterally, adequate air movement, no  wheezing, rhonchi or rales. Gastrointestinal. Abdomen with no organomegaly, non tender, no rebound or guarding Skin. No rashes Musculoskeletal: bilateral bka, dressing on the left.      Data Reviewed: I have personally reviewed following labs and imaging studies  CBC: Recent Labs  Lab 09/27/17 2123 09/29/17 0655 09/30/17 0702 09/30/17 2212 10/01/17 0638  WBC 8.9 8.7 9.3 6.2 5.7  NEUTROABS 7.4  --   --   --   --   HGB 12.0* 9.4* 9.0* 8.0* 7.8*  HCT 36.4* 28.6* 27.3* 24.0* 23.4*  MCV 101.1* 102.9* 103.8* 101.3* 101.3*  PLT 230 193 217 199 208   Basic Metabolic Panel: Recent Labs  Lab 09/29/17 0655 09/30/17 0702 09/30/17 2212 10/01/17 0638 10/02/17 0519  NA 135 134* 132* 132* 137  K 4.8 4.2 4.1 4.2 3.7  CL 92* 90* 89* 90* 96*  CO2 23 26 26 24 27   GLUCOSE 146* 164* 151* 116* 82  BUN 44* 24* 36* 45* 27*  CREATININE 9.69* 6.09* 7.22* 8.26* 5.16*  CALCIUM 9.3 9.3 9.1 8.9 8.9  PHOS  --   --  7.1* 7.7*  --    GFR: Estimated Creatinine Clearance: 12.5 mL/min (A) (by C-G formula based on SCr of 5.16 mg/dL (H)). Liver Function Tests: Recent Labs  Lab 09/27/17 2123 09/29/17 0655 09/30/17 2212 10/01/17 0638  AST 45* 48*  --   --   ALT 20 16*  --   --   ALKPHOS 53 45  --   --   BILITOT 1.0 1.0  --   --   PROT 7.3 6.4*  --   --   ALBUMIN 3.5 2.9* 2.4* 2.5*   No results for input(s): LIPASE, AMYLASE in the last 168 hours. No results for input(s): AMMONIA in the last 168 hours. Coagulation Profile: No results for input(s): INR, PROTIME in the last 168 hours. Cardiac Enzymes: No results for input(s): CKTOTAL, CKMB, CKMBINDEX, TROPONINI in the last 168 hours. BNP (last 3 results) No results for input(s): PROBNP in the last 8760 hours. HbA1C: No results for input(s): HGBA1C in the last 72 hours. CBG: Recent Labs  Lab 10/02/17 1133 10/02/17 1615 10/02/17 2117 10/03/17 0026 10/03/17 0645  GLUCAP 89 117* 108* 102* 98   Lipid Profile: No results for input(s):  CHOL, HDL, LDLCALC, TRIG, CHOLHDL, LDLDIRECT in the last 72 hours. Thyroid Function Tests: No results for input(s): TSH, T4TOTAL, FREET4, T3FREE, THYROIDAB in the last 72 hours. Anemia Panel: No results for input(s): VITAMINB12, FOLATE, FERRITIN, TIBC, IRON, RETICCTPCT in the last 72 hours.    Radiology Studies: I have reviewed all of the imaging during this hospital visit personally     Scheduled Meds: . aspirin  325 mg Oral Daily  . atorvastatin  80 mg Oral QPM  . clopidogrel  75 mg Oral QPM  . darbepoetin (ARANESP) injection - DIALYSIS  150 mcg Intravenous Q Fri-HD  . docusate sodium  100 mg Oral BID  . doxercalciferol  3 mcg Intravenous Q M,W,F-HD  . feeding supplement (NEPRO CARB STEADY)  237 mL Oral BID BM  . heparin injection (subcutaneous)  5,000 Units Subcutaneous Q8H  . insulin aspart  0-5 Units Subcutaneous QHS  . insulin aspart  0-9 Units Subcutaneous TID WC  . insulin glargine  10 Units Subcutaneous QHS  . lactulose  30 g Oral Once  . metoprolol tartrate  25 mg Oral BID  . multivitamin  1 tablet Oral QHS  . mupirocin ointment  1 application Nasal BID  . polyethylene glycol  17 g Oral Daily  . sevelamer carbonate  2,400 mg Oral TID WC   Continuous Infusions:   LOS: 6 days        Mauricio Annett Gula, MD [facility] Pager [phone]

## 2017-10-03 NOTE — Progress Notes (Addendum)
[facility] Progress Note   Subjective:   Ready to leave hospital. No new complaints.   Objective Vitals:   10/02/17 1337 10/02/17 2119 10/02/17 2133 10/03/17 0640  BP: (!) 145/53 (!) 119/50 (!) 138/57 (!) 126/55  Pulse: 82 93 81 80  Resp: 18 18    Temp: 98.3 F (36.8 C) 98.4 F (36.9 C)  98.6 F (37 C)  TempSrc: Oral Oral  Oral  SpO2: 96% 99%  100%  Weight:      Height:       Physical Exam General:NAD, chronically ill appearing male, laying in bed Heart:RRR, +2/6 systolic murmur, no rg Lungs:CTAb anterior/laterally, nml WOB Abdomen:soft, NTND, +BS Extremities:b/l BKA, L wrapped, R in sock, no stump edema Dialysis Access: LU AVF +b/t   Filed Weights   09/29/17 1555 10/01/17 0715 10/01/17 1128  Weight: 87 kg (191 lb 12.8 oz) 84 kg (185 lb 3 oz) 83 kg (182 lb 15.7 oz)    Intake/Output Summary (Last 24 hours) at 10/03/2017 1008 Last data filed at 10/02/2017 2139 Gross per 24 hour  Intake 360 ml  Output -  Net 360 ml    Additional Objective Labs: Basic Metabolic Panel: Recent Labs  Lab 09/30/17 2212 10/01/17 0638 10/02/17 0519  NA 132* 132* 137  K 4.1 4.2 3.7  CL 89* 90* 96*  CO2 26 24 27   GLUCOSE 151* 116* 82  BUN 36* 45* 27*  CREATININE 7.22* 8.26* 5.16*  CALCIUM 9.1 8.9 8.9  PHOS 7.1* 7.7*  --    Liver Function Tests: Recent Labs  Lab 09/27/17 2123 09/29/17 0655 09/30/17 2212 10/01/17 0638  AST 45* 48*  --   --   ALT 20 16*  --   --   ALKPHOS 53 45  --   --   BILITOT 1.0 1.0  --   --   PROT 7.3 6.4*  --   --   ALBUMIN 3.5 2.9* 2.4* 2.5*   CBC: Recent Labs  Lab 09/27/17 2123 09/29/17 0655 09/30/17 0702 09/30/17 2212 10/01/17 0638  WBC 8.9 8.7 9.3 6.2 5.7  NEUTROABS 7.4  --   --   --   --   HGB 12.0* 9.4* 9.0* 8.0* 7.8*  HCT 36.4* 28.6* 27.3* 24.0* 23.4*  MCV 101.1* 102.9* 103.8* 101.3* 101.3*  PLT 230 193 217 199 208   CBG: Recent Labs  Lab 10/02/17 1133 10/02/17 1615 10/02/17 2117 10/03/17 0026 10/03/17 0645   GLUCAP 89 117* 108* 102* 98   Studies/Results: No results found.  Medications:  . aspirin  325 mg Oral Daily  . atorvastatin  80 mg Oral QPM  . clopidogrel  75 mg Oral QPM  . darbepoetin (ARANESP) injection - DIALYSIS  150 mcg Intravenous Q Fri-HD  . docusate sodium  100 mg Oral BID  . doxercalciferol  3 mcg Intravenous Q M,W,F-HD  . feeding supplement (NEPRO CARB STEADY)  237 mL Oral BID BM  . heparin injection (subcutaneous)  5,000 Units Subcutaneous Q8H  . insulin aspart  0-5 Units Subcutaneous QHS  . insulin aspart  0-9 Units Subcutaneous TID WC  . insulin glargine  10 Units Subcutaneous QHS  . lactulose  30 g Oral Once  . metoprolol tartrate  25 mg Oral BID  . multivitamin  1 tablet Oral QHS  . mupirocin ointment  1 application Nasal BID  . polyethylene glycol  17 g Oral Daily  . sevelamer carbonate  2,400 mg Oral TID WC    Dialysis Orders: MWF -  [facility] 4hrs, [place], [id number], EDW 82.5kg,2K/2.5Ca,Linear Na  Access:LU AVF Heparin5000 Unit bolus Hectorol qHD   Assessment/Plan: 1. L distal femur fracture- b/l BKA, s/p ORIF on 4/9 Dr. Eulah Pont. Pain well controlled. D/c to SNF when placed. Per primary.  2. ESRD - Orders written for HD tomorrow. Continue HD per regular MWF schedulevia AVF while admitted. K 3.7. 3. Hypotension/volume - Bp variable on MTP. Appears euvolemic on exam. Titrate down as tolerated. 4. Anemia - Hgb 9.4>8.0>7.8 post OP.Follow trend. Repeat labs preHD. No ESA as OP- started on Aranesp qwk 1st dose 4/12, no IV iron due to allergy.  5. Secondary Hyperparathyroidism- CCa 10.2. P 7.7. Continue VDRA and binders. Changed to 2K/2Ca bath d/t borderline high Ca. 6. Nutrition - Alb 2.5. Renal diet with fluid restrictions. Nepro.  7. DMT2 - on insulin, per primary 8. CAD - cardio consulted for pre op clearance, now signed off. Per primary 9. Dispo - ok from renal standpoint.  Looking for SNF placement. Will  require assistance with transportation to HD. Need to make sure HD is considered in SNF placement, may require change in OP unit while in rehab.  Social work notes say that the sister will be assisting w/ transportation to HD.  He is not one of our dialysis patients [facility]) so we will defer to SW team regarding transportation from SNF to HD in the OP setting.      Virgina Norfolk, PA-C [facility] Pager: [phone] 10/03/2017,10:08 AM  LOS: 6 days   Pt seen, examined, agree w assess/plan as above with additions as indicated.  Todd Moselle MD [employer] pager [phone]    cell [phone] 10/03/2017, 10:47 AM

## 2017-10-04 LAB — BASIC METABOLIC PANEL
Anion gap: 20 — ABNORMAL HIGH (ref 5–15)
BUN: 66 mg/dL — ABNORMAL HIGH (ref 6–20)
CALCIUM: 9 mg/dL (ref 8.9–10.3)
CO2: 22 mmol/L (ref 22–32)
Chloride: 91 mmol/L — ABNORMAL LOW (ref 101–111)
Creatinine, Ser: 9.67 mg/dL — ABNORMAL HIGH (ref 0.61–1.24)
GFR calc Af Amer: 5 mL/min — ABNORMAL LOW (ref >60)
GFR, EST NON AFRICAN AMERICAN: 5 mL/min — AB (ref >60)
Glucose, Bld: 107 mg/dL — ABNORMAL HIGH (ref 65–99)
POTASSIUM: 4.7 mmol/L (ref 3.5–5.1)
SODIUM: 133 mmol/L — AB (ref 135–145)

## 2017-10-04 LAB — CBC WITH DIFFERENTIAL/PLATELET
BASOS ABS: 0.1 10*3/uL (ref 0.0–0.1)
Basophils Relative: 1 %
EOS PCT: 5 %
Eosinophils Absolute: 0.3 10*3/uL (ref 0.0–0.7)
HEMATOCRIT: 26.2 % — AB (ref 39.0–52.0)
Hemoglobin: 8.5 g/dL — ABNORMAL LOW (ref 13.0–17.0)
LYMPHS PCT: 17 %
Lymphs Abs: 1.2 10*3/uL (ref 0.7–4.0)
MCH: 33.2 pg (ref 26.0–34.0)
MCHC: 32.4 g/dL (ref 30.0–36.0)
MCV: 102.3 fL — AB (ref 78.0–100.0)
Monocytes Absolute: 0.4 10*3/uL (ref 0.1–1.0)
Monocytes Relative: 6 %
NEUTROS ABS: 4.9 10*3/uL (ref 1.7–7.7)
Neutrophils Relative %: 71 %
PLATELETS: 411 10*3/uL — AB (ref 150–400)
RBC: 2.56 MIL/uL — AB (ref 4.22–5.81)
RDW: 14 % (ref 11.5–15.5)
WBC: 6.9 10*3/uL (ref 4.0–10.5)

## 2017-10-04 LAB — GLUCOSE, CAPILLARY
Glucose-Capillary: 99 mg/dL (ref 65–99)
Glucose-Capillary: 99 mg/dL (ref 65–99)

## 2017-10-04 MED ORDER — SODIUM CHLORIDE 0.9 % IV SOLN: 100.0000 mL | INTRAVENOUS | Status: DC | PRN

## 2017-10-04 MED ORDER — INSULIN LISPRO 100 UNIT/ML ~~LOC~~ SOLN: SUBCUTANEOUS | 11 refills | Status: AC

## 2017-10-04 MED ORDER — DOXERCALCIFEROL 4 MCG/2ML IV SOLN
INTRAVENOUS | Status: AC
  Filled 2017-10-04: qty 2

## 2017-10-04 MED ORDER — OXYCODONE HCL 5 MG PO TABS
ORAL_TABLET | ORAL | Status: AC
  Filled 2017-10-04: qty 2

## 2017-10-04 MED ORDER — LIDOCAINE-PRILOCAINE 2.5-2.5 % EX CREA: 1.0000 "application " | TOPICAL_CREAM | CUTANEOUS | Status: DC | PRN

## 2017-10-04 MED ORDER — HEPARIN SODIUM (PORCINE) 1000 UNIT/ML DIALYSIS: 1000.0000 [IU] | INTRAMUSCULAR | Status: DC | PRN

## 2017-10-04 MED ORDER — ALTEPLASE 2 MG IJ SOLR: 2.0000 mg | Freq: Once | INTRAMUSCULAR | Status: DC | PRN

## 2017-10-04 MED ORDER — NEPRO/CARBSTEADY PO LIQD: 237.0000 mL | Freq: Two times a day (BID) | ORAL | 0 refills | Status: AC

## 2017-10-04 MED ORDER — INSULIN GLARGINE 100 UNIT/ML ~~LOC~~ SOLN: 10.0000 [IU] | Freq: Every day | SUBCUTANEOUS | 0 refills | Status: AC

## 2017-10-04 MED ORDER — PENTAFLUOROPROP-TETRAFLUOROETH EX AERO: 1.0000 "application " | INHALATION_SPRAY | CUTANEOUS | Status: DC | PRN

## 2017-10-04 MED ORDER — LIDOCAINE HCL (PF) 1 % IJ SOLN: 5.0000 mL | INTRAMUSCULAR | Status: DC | PRN

## 2017-10-04 MED ORDER — SEVELAMER CARBONATE 800 MG PO TABS: 2400.0000 mg | ORAL_TABLET | Freq: Three times a day (TID) | ORAL | 0 refills | Status: AC

## 2017-10-04 NOTE — Discharge Summary (Addendum)
Physician Discharge Summary  Todd Coleman ZOX:[id number] DOB: July 02, 1940 DOA: 09/27/2017  PCP: Romie Jumper, PA-C  Admit date: 09/27/2017 Discharge date: 10/04/2017  Admitted From: Home Disposition:  SNF  Recommendations for Outpatient Follow-up and new medication changes:  1. Follow up with PCP in 1- week 2. Patient has been placed on oxycodone for pain control 3. Continue calcitriol and sevelamer.   Home Health: na  Equipment/Devices:na   Discharge Condition: stable CODE STATUS: DNR  Diet recommendation: Heart healthy and diabetic prudent.   Brief/Interim Summary: 77 year old male who presented after a mechanical fall. Patient does have a significant past medical history of coronary artery disease, end-stage renal disease on hemodialysis, hypertension, dyslipidemia,type 2 diabetes mellitus and Barrett's esophagus. Patient fell from his motorized chair landing on his left knee and face, developing significant pain post trauma. He had a recent hospitalization for volume overload, required hemodialysis. On his initial physical examination blood pressure 137/66, heart rate 93, respiration 19, temperature 97.4, oxygen saturation 100%. Moist mucous membranes, lungs were clear to auscultation bilaterally, heart S1-S2 present and rhythmic,abdomen soft nontender, no lower extremity edema. Noted shortening of the left lower extremity.Sodium 133, potassium 4.4, chloride 91, bicarb 25, glucose 243, BUN 26, creatinine 6.28, white count 9.9, hemoglobin 12.0, hematocrit 36.4, platelets 230.Chest x-ray, hypoinflated, no infiltrates. Left lowerextremity films with acute distal femur fracture.EKG sinus rhythm, normal axis, normal intervals.  Patient was admitted to the hospital with the working diagnosis of acute left distal femur fracture.  1.  Acute left distal femur fracture.  Patient was admitted to the medical ward, he received analgesia and DVT prophylaxis.  He was seen by orthopedic  service, and underwent open reduction internal fixation of distal femur fracture on the left, with good toleration.  He was preoperatively evaluated by cardiology.  Postprocedure physical therapy recommendations to continue therapy at a skilled nursing facility.  2.  End-stage renal disease on hemodialysis.  Patient underwent hemodialysis per his routine schedule, with good toleration, no volume overload or significant electrolyte disturbances.  Continue calcitriol and sevelamer.   3.  Hypertension.  Blood pressure remained well controlled with metoprolol 25 g twice daily.  4.  Type 2 diabetes mellitus.  Patient was placed on insulin sliding scale for glucose coverage and monitor, his basal dose of insulin was decreased to 10 units daily with good toleration.  His discharge fasting glucose is 107.  5.  Coronary artery disease.  Patient remained chest pain-free, he was considered moderate risk for a moderate risk procedure in the preoperative evaluation.  Aspirin and clopidogrel were resumed after surgical intervention.   6.  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  Hemoglobin and hematocrit remained stable, and was treated with Aranesp.  Noted no IV iron due to allergy. No signs of significant blood loss.     Discharge Diagnoses:  Principal Problem:   Closed left femoral fracture (HCC) Active Problems:   ESRD (end stage renal disease) (HCC)   S/P bilateral BKA (below knee amputation) (HCC)   HTN (hypertension)   DM (diabetes mellitus), type 2 with renal complications (HCC)   CAD (coronary artery disease)   PAD (peripheral artery disease) (HCC)   Femoral fracture (HCC)   Prolonged QT interval    Discharge Instructions   Allergies as of 10/04/2017      Reactions   Dilaudid [hydromorphone Hcl] Other (See Comments)   "makes me crazy".   Penicillins Rash   Ramipril Rash      Medication List  STOP taking these medications   labetalol 200 MG tablet Commonly known as:   NORMODYNE     TAKE these medications   acetaminophen 500 MG tablet Commonly known as:  TYLENOL Take 2 tablets (1,000 mg total) by mouth every 8 (eight) hours for 14 days. For Pain.   aspirin EC 325 MG tablet Take 1 tablet (325 mg total) by mouth daily for 14 days. For 14 days post op for DVT Prophylaxis.  Resume 81 mg Aspirin when completed. What changed:    medication strength  how much to take  when to take this  additional instructions   atorvastatin 80 MG tablet Commonly known as:  LIPITOR Take 80 mg by mouth every evening.   calcitRIOL 0.25 MCG capsule Commonly known as:  ROCALTROL Take 0.25-0.5 mcg by mouth See admin instructions. Take 2 capsules (0.27mcg) by mouth Mon thru Sat and take 1 capsule (0.43mcg) on Sun.   clopidogrel 75 MG tablet Commonly known as:  PLAVIX Take 75 mg by mouth every evening.   docusate sodium 100 MG capsule Commonly known as:  COLACE Take 1 capsule (100 mg total) by mouth 2 (two) times daily. To prevent constipation while taking pain medication.   doxazosin 2 MG tablet Commonly known as:  CARDURA Take 2 mg by mouth daily.   feeding supplement (NEPRO CARB STEADY) Liqd Take 237 mLs by mouth 2 (two) times daily between meals.   fenofibrate 145 MG tablet Commonly known as:  TRICOR Take 145 mg by mouth daily.   insulin glargine 100 UNIT/ML injection Commonly known as:  LANTUS Inject 0.1 mLs (10 Units total) into the skin at bedtime. What changed:  how much to take   insulin lispro 100 UNIT/ML injection Commonly known as:  HUMALOG For glucose 121-150 use 1 unit, for 151-200 use 2 units, for 201-250 use 3 units, for 251-300 use 5 units, for 301-350 use 7 units, for 351 or greater use 9 units.   metoprolol tartrate 25 MG tablet Commonly known as:  LOPRESSOR Take 25 mg by mouth daily.   multivitamin Tabs tablet Take 1 tablet by mouth daily.   omeprazole 40 MG capsule Commonly known as:  PRILOSEC Take 40 mg by mouth daily.    oxyCODONE 5 MG immediate release tablet Commonly known as:  ROXICODONE Take 1 tablet (5 mg total) by mouth every 4 (four) hours as needed for breakthrough pain.   sevelamer carbonate 800 MG tablet Commonly known as:  RENVELA Take 3 tablets (2,400 mg total) by mouth 3 (three) times daily with meals.   vitamin C 100 MG tablet Take 100 mg by mouth every evening.   vitamin E 400 UNIT capsule Take 400 Units by mouth every evening.      Follow-up Information    Sheral Apley, MD Follow up in 10 day(s).   Specialty:  Orthopedic Surgery Contact information: [address] [place] [address] [phone]          Allergies  Allergen Reactions  . Dilaudid [Hydromorphone Hcl] Other (See Comments)    "makes me crazy".  . Penicillins Rash  . Ramipril Rash    Consultations:  Orthopedic surgery   Procedures/Studies: Dg Knee 1-2 Views Left  Result Date: 09/27/2017 CLINICAL DATA:  LEFT hip and knee pain after fall from wheelchair. History of LEFT hip surgery. EXAM: LEFT KNEE - 1-2 VIEW COMPARISON:  None available for comparison at time of study interpretation. FINDINGS: Limited assessment due to nonstandard views, per technologist  patient was in pain and had difficulty maintaining position for imaging. Status post LEFT below-knee amputation. Acute comminuted impacted femoral metadiaphysis fracture. No definite intra-articular extension. Osteopenia. No destructive bony lesions. Severe vascular calcification and stent and popliteal fossa. IMPRESSION: Acute distal femur fracture.  No dislocation. Status post below-knee amputation. Electronically Signed   By: Awilda Metro M.D.   On: 09/27/2017 20:48   Ct Head Wo Contrast  Result Date: 09/27/2017 CLINICAL DATA:  Fall from wheelchair. EXAM: CT HEAD WITHOUT CONTRAST CT CERVICAL SPINE WITHOUT CONTRAST TECHNIQUE: Multidetector CT imaging of the head and cervical spine was performed following the standard protocol  without intravenous contrast. Multiplanar CT image reconstructions of the cervical spine were also generated. COMPARISON:  08/03/2016 FINDINGS: CT HEAD FINDINGS Brain: There is diffuse low attenuation throughout the subcortical and periventricular white matter compatible with chronic small vessel ischemic disease. Prominence of the sulci and ventricles are noted compatible with brain atrophy. No evidence for acute infarction, hemorrhage, hydrocephalus, extra-axial collection or mass lesion/mass effect. Vascular: No hyperdense vessel or unexpected calcification. Skull: Normal. Negative for fracture or focal lesion. Sinuses/Orbits: There is mild mucosal thickening involving the right maxillary sinus. Partial opacification of the left mastoid air cells. Other: None CT CERVICAL SPINE FINDINGS Alignment: Normal. Skull base and vertebrae: No acute fracture. No primary bone lesion or focal pathologic process. Soft tissues and spinal canal: No prevertebral fluid or swelling. No visible canal hematoma. Disc levels: Disc space narrowing and ventral spurring noted at C5-6 and C6-7. Upper chest: Negative. Other: None IMPRESSION: 1. No acute intracranial abnormalities. Chronic small vessel ischemic disease and brain atrophy noted. 2. No evidence for cervical spine fracture or dislocation. 3. 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 Electronically Signed   By: Signa Kell M.D.   On: 09/27/2017 20:50   Ct Cervical Spine Wo Contrast  Result Date: 09/27/2017 CLINICAL DATA:  Fall from wheelchair. EXAM: CT HEAD WITHOUT CONTRAST CT CERVICAL SPINE WITHOUT CONTRAST TECHNIQUE: Multidetector CT imaging of the head and cervical spine was performed following the standard protocol without intravenous contrast. Multiplanar CT image reconstructions of the cervical spine were also generated. COMPARISON:  08/03/2016 FINDINGS: CT HEAD FINDINGS Brain: There is diffuse low attenuation throughout the subcortical and  periventricular white matter compatible with chronic small vessel ischemic disease. Prominence of the sulci and ventricles are noted compatible with brain atrophy. No evidence for acute infarction, hemorrhage, hydrocephalus, extra-axial collection or mass lesion/mass effect. Vascular: No hyperdense vessel or unexpected calcification. Skull: Normal. Negative for fracture or focal lesion. Sinuses/Orbits: There is mild mucosal thickening involving the right maxillary sinus. Partial opacification of the left mastoid air cells. Other: None CT CERVICAL SPINE FINDINGS Alignment: Normal. Skull base and vertebrae: No acute fracture. No primary bone lesion or focal pathologic process. Soft tissues and spinal canal: No prevertebral fluid or swelling. No visible canal hematoma. Disc levels: Disc space narrowing and ventral spurring noted at C5-6 and C6-7. Upper chest: Negative. Other: None IMPRESSION: 1. No acute intracranial abnormalities. Chronic small vessel ischemic disease and brain atrophy noted. 2. No evidence for cervical spine fracture or dislocation. 3. 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 Electronically Signed   By: Signa Kell M.D.   On: 09/27/2017 20:50   Dg Chest Port 1 View  Result Date: 09/27/2017 CLINICAL DATA:  Fall out of wheelchair today. EXAM: PORTABLE CHEST 1 VIEW COMPARISON:  Radiograph 08/30/2017 FINDINGS: Low lung volumes. Similar in size and mediastinal contours to prior exam allowing for differences  in technique. Improved pulmonary edema and vascular congestion. Streaky left lung base atelectasis or scarring. No focal airspace disease. No pneumothorax or large pleural effusion. Vascular stent in the left axilla again seen. No evidence of acute osseous abnormality. IMPRESSION: Low lung volumes without acute abnormality or evidence of acute traumatic injury. Electronically Signed   By: Rubye Oaks M.D.   On: 09/27/2017 21:38   Dg C-arm 1-60 Min  Result Date:  09/28/2017 CLINICAL DATA:  77 year old male with distal left femur fracture. Subsequent encounter. EXAM: DG C-ARM 61-120 MIN; LEFT FEMUR 2 VIEWS Fluoroscopic time: 2 minutes and 24 seconds COMPARISON:  09/27/2017 plain film exam. FINDINGS: Four intraoperative C-arm views submitted for review after procedure. Remote left hip pinning. Left femoral intramedullary rod placed across the distal left femur fracture with proximal and distal fixation screws. Fracture fragments slightly separated and angulated. Prominent vascular calcifications. IMPRESSION: Open reduction and internal fixation of distal left femur fracture as detailed above. Electronically Signed   By: Lacy Duverney M.D.   On: 09/28/2017 14:40   Dg Hip Unilat W Or Wo Pelvis 2-3 Views Left  Result Date: 09/27/2017 CLINICAL DATA:  Left hip and knee pain after fall from wheelchair today. EXAM: DG HIP (WITH OR WITHOUT PELVIS) 2-3V LEFT COMPARISON:  06/13/2016 intraoperative radiograph of the left hip. FINDINGS: Four cannulated screws are noted along the long axis of the left femoral neck traversing a chronic subcapital impacted left femoral neck fracture. No change in configuration. No evidence of acute fracture, hardware failure or joint dislocation. Intact bony pelvis. Lower lumbar facet arthropathy at L5-S1. Aorto-bi-iliofemoral atherosclerosis with vascular surgical clips projecting over the groin bilaterally. IMPRESSION: Negative for acute fracture or malalignment. Intact left femoral neck fixation. Electronically Signed   By: Tollie Eth M.D.   On: 09/27/2017 20:19   Dg Femur Min 2 Views Left  Result Date: 09/28/2017 CLINICAL DATA:  77 year old male with distal left femur fracture. Subsequent encounter. EXAM: DG C-ARM 61-120 MIN; LEFT FEMUR 2 VIEWS Fluoroscopic time: 2 minutes and 24 seconds COMPARISON:  09/27/2017 plain film exam. FINDINGS: Four intraoperative C-arm views submitted for review after procedure. Remote left hip pinning. Left femoral  intramedullary rod placed across the distal left femur fracture with proximal and distal fixation screws. Fracture fragments slightly separated and angulated. Prominent vascular calcifications. IMPRESSION: Open reduction and internal fixation of distal left femur fracture as detailed above. Electronically Signed   By: Lacy Duverney M.D.   On: 09/28/2017 14:40       Subjective: Patient is feeling better, no nausea or vomiting, no chest pain. Positive fatigue after HD./  Discharge Exam: Vitals:   10/04/17 1130 10/04/17 1203  BP: (!) 99/40 131/68  Pulse: 67 76  Resp: 16 17  Temp:  97.8 F (36.6 C)  SpO2:  94%   Vitals:   10/04/17 1030 10/04/17 1100 10/04/17 1130 10/04/17 1203  BP: (!) 115/34 (!) 104/42 (!) 99/40 131/68  Pulse: 69 74 67 76  Resp: 14 15 16 17   Temp:    97.8 F (36.6 C)  TempSrc:    Oral  SpO2:    94%  Weight:    80.3 kg (177 lb 0.5 oz)  Height:        General: Not in pain or dyspnea Neurology: Awake and alert, non focal  E ENT: mild pallor, no icterus, oral mucosa dry Cardiovascular: No JVD. S1-S2 present, rhythmic, no gallops, rubs, or murmurs. No lower extremity edema. Pulmonary: vesicular breath sounds bilaterally, adequate air  movement, no wheezing, rhonchi or rales. Gastrointestinal. Abdomen flat, no organomegaly, non tender, no rebound or guarding Skin. No rashes Musculoskeletal: bilateral bka. Surgical wound with dressing in place on the left.    The results of significant diagnostics from this hospitalization (including imaging, microbiology, ancillary and laboratory) are listed below for reference.     Microbiology: Recent Results (from the past 240 hour(s))  Surgical pcr screen     Status: Abnormal   Collection Time: 09/28/17  5:54 AM  Result Value Ref Range Status   MRSA, PCR POSITIVE (A) NEGATIVE Final    Comment: RESULT CALLED TO, READ BACK BY AND VERIFIED WITH: M. Peppeh RN 9:40 09/28/17 ([email or web address])    Staphylococcus aureus POSITIVE (A)  NEGATIVE Final    Comment: (NOTE) The Xpert SA Assay (FDA approved for NASAL specimens in patients 75 years of age and older), is one component of a comprehensive surveillance program. It is not intended to diagnose infection nor to guide or monitor treatment.      Labs: BNP (last 3 results) No results for input(s): BNP in the last 8760 hours. Basic Metabolic Panel: Recent Labs  Lab 09/30/17 2212 10/01/17 0638 10/02/17 0519 10/03/17 1145 10/04/17 0630  NA 132* 132* 137 134* 133*  K 4.1 4.2 3.7 4.4 4.7  CL 89* 90* 96* 92* 91*  CO2 26 24 27 26 22   GLUCOSE 151* 116* 82 122* 107*  BUN 36* 45* 27* 49* 66*  CREATININE 7.22* 8.26* 5.16* 7.98* 9.67*  CALCIUM 9.1 8.9 8.9 8.8* 9.0  PHOS 7.1* 7.7*  --   --   --    Liver Function Tests: Recent Labs  Lab 09/27/17 2123 09/29/17 0655 09/30/17 2212 10/01/17 0638  AST 45* 48*  --   --   ALT 20 16*  --   --   ALKPHOS 53 45  --   --   BILITOT 1.0 1.0  --   --   PROT 7.3 6.4*  --   --   ALBUMIN 3.5 2.9* 2.4* 2.5*   No results for input(s): LIPASE, AMYLASE in the last 168 hours. No results for input(s): AMMONIA in the last 168 hours. CBC: Recent Labs  Lab 09/27/17 2123 09/29/17 0655 09/30/17 0702 09/30/17 2212 10/01/17 0638 10/04/17 0630  WBC 8.9 8.7 9.3 6.2 5.7 6.9  NEUTROABS 7.4  --   --   --   --  4.9  HGB 12.0* 9.4* 9.0* 8.0* 7.8* 8.5*  HCT 36.4* 28.6* 27.3* 24.0* 23.4* 26.2*  MCV 101.1* 102.9* 103.8* 101.3* 101.3* 102.3*  PLT 230 193 217 199 208 411*   Cardiac Enzymes: No results for input(s): CKTOTAL, CKMB, CKMBINDEX, TROPONINI in the last 168 hours. BNP: Invalid input(s): POCBNP CBG: Recent Labs  Lab 10/03/17 1142 10/03/17 1543 10/03/17 2325 10/04/17 0630 10/04/17 0941  GLUCAP 128* 108* 114* 99 99   D-Dimer No results for input(s): DDIMER in the last 72 hours. Hgb A1c No results for input(s): HGBA1C in the last 72 hours. Lipid Profile No results for input(s): CHOL, HDL, LDLCALC, TRIG, CHOLHDL,  LDLDIRECT in the last 72 hours. Thyroid function studies No results for input(s): TSH, T4TOTAL, T3FREE, THYROIDAB in the last 72 hours.  Invalid input(s): FREET3 Anemia work up No results for input(s): VITAMINB12, FOLATE, FERRITIN, TIBC, IRON, RETICCTPCT in the last 72 hours. Urinalysis No results found for: COLORURINE, APPEARANCEUR, LABSPEC, PHURINE, GLUCOSEU, HGBUR, BILIRUBINUR, KETONESUR, PROTEINUR, UROBILINOGEN, NITRITE, LEUKOCYTESUR Sepsis Labs Invalid input(s): PROCALCITONIN,  WBC,  LACTICIDVEN Microbiology Recent Results (from the  past 240 hour(s))  Surgical pcr screen     Status: Abnormal   Collection Time: 09/28/17  5:54 AM  Result Value Ref Range Status   MRSA, PCR POSITIVE (A) NEGATIVE Final    Comment: RESULT CALLED TO, READ BACK BY AND VERIFIED WITH: M. Peppeh RN 9:40 09/28/17 ([email or web address])    Staphylococcus aureus POSITIVE (A) NEGATIVE Final    Comment: (NOTE) The Xpert SA Assay (FDA approved for NASAL specimens in patients 59 years of age and older), is one component of a comprehensive surveillance program. It is not intended to diagnose infection nor to guide or monitor treatment.      Time coordinating discharge: 45 minutes  SIGNED:   Coralie Keens, MD  [facility] 10/04/2017, 1:10 PM Pager [phone]  If 7PM-7AM, please contact night-coverage [email or web address] Password [email or web address]

## 2017-10-04 NOTE — Social Work (Signed)
Clinical Social Worker facilitated patient discharge including contacting patient family and facility to confirm patient discharge plans.  Clinical information faxed to facility and family agreeable with plan.    CSW arranged ambulance transport via [facility].    RN to call [phone] to give report prior to discharge. Pt will go to Room 314.  Clinical Social Worker will sign off for now as social work intervention is no longer needed. Please consult us again if new need arises.  Keene BreathPatricia Katha Kuehne, LCSW Clinical Social Worker [phone]

## 2017-10-04 NOTE — Progress Notes (Signed)
[facility]

## 2017-10-04 NOTE — Progress Notes (Signed)
OT Cancellation Note  Patient Details Name: Todd RobertsonDaniel Coleman MRN: [id number] DOB: 09/20/1940   Cancelled Treatment:    Reason Eval/Treat Not Completed: Patient at procedure or test/ unavailable (HD); will follow up as schedule allows.  Todd Coleman, OT Pager [phone] 10/04/2017   Todd Coleman 10/04/2017, 9:52 AM

## 2017-10-04 NOTE — Social Work (Signed)
CSW met with patient at bedside. Pt has just returned from dialysis and is not able to discuss SNF at this time.   CSW then called [facility] and left another message for admission staff.   CSW then called sister to discuss SNF offers. Family has accepted bed offer from [facility].  CSW then called Robin at [facility] and confirmed bed offer.  CSW will f/u for disposition.  Elissa Hefty, LCSW Clinical Social Worker [phone]

## 2017-10-04 NOTE — Progress Notes (Signed)
[facility] Progress Note   Subjective:   Ready to leave hospital. No new complaints.   Objective Vitals:   10/04/17 0802 10/04/17 0815 10/04/17 0830 10/04/17 0900  BP: (!) 165/71 131/67 (!) 152/66 (!) 131/52  Pulse: 84 78 78 73  Resp: 18 16 15 16   Temp:      TempSrc:      SpO2:      Weight:      Height:       Physical Exam General:NAD, chronically ill appearing male, laying in bed Heart:RRR, +2/6 systolic murmur, no rg Lungs:CTAb anterior/laterally, nml WOB Abdomen:soft, NTND, +BS Extremities:b/l BKA, L wrapped, R in sock, no stump edema Dialysis Access: LU AVF +b/t   Filed Weights   10/01/17 0715 10/01/17 1128 10/04/17 0755  Weight: 84 kg (185 lb 3 oz) 83 kg (182 lb 15.7 oz) 82.8 kg (182 lb 8.7 oz)    Intake/Output Summary (Last 24 hours) at 10/04/2017 0928 Last data filed at 10/03/2017 1800 Gross per 24 hour  Intake 380 ml  Output -  Net 380 ml    Additional Objective Labs: Basic Metabolic Panel: Recent Labs  Lab 09/30/17 2212 10/01/17 0638 10/02/17 0519 10/03/17 1145 10/04/17 0630  NA 132* 132* 137 134* 133*  K 4.1 4.2 3.7 4.4 4.7  CL 89* 90* 96* 92* 91*  CO2 26 24 27 26 22   GLUCOSE 151* 116* 82 122* 107*  BUN 36* 45* 27* 49* 66*  CREATININE 7.22* 8.26* 5.16* 7.98* 9.67*  CALCIUM 9.1 8.9 8.9 8.8* 9.0  PHOS 7.1* 7.7*  --   --   --    Liver Function Tests: Recent Labs  Lab 09/27/17 2123 09/29/17 0655 09/30/17 2212 10/01/17 0638  AST 45* 48*  --   --   ALT 20 16*  --   --   ALKPHOS 53 45  --   --   BILITOT 1.0 1.0  --   --   PROT 7.3 6.4*  --   --   ALBUMIN 3.5 2.9* 2.4* 2.5*   CBC: Recent Labs  Lab 09/27/17 2123 09/29/17 0655 09/30/17 0702 09/30/17 2212 10/01/17 0638 10/04/17 0630  WBC 8.9 8.7 9.3 6.2 5.7 6.9  NEUTROABS 7.4  --   --   --   --  4.9  HGB 12.0* 9.4* 9.0* 8.0* 7.8* 8.5*  HCT 36.4* 28.6* 27.3* 24.0* 23.4* 26.2*  MCV 101.1* 102.9* 103.8* 101.3* 101.3* 102.3*  PLT 230 193 217 199 208 411*   CBG: Recent  Labs  Lab 10/03/17 0645 10/03/17 1142 10/03/17 1543 10/03/17 2325 10/04/17 0630  GLUCAP 98 128* 108* 114* 99   Studies/Results: No results found.  Medications: . sodium chloride    . sodium chloride     . aspirin  325 mg Oral Daily  . atorvastatin  80 mg Oral QPM  . clopidogrel  75 mg Oral QPM  . darbepoetin (ARANESP) injection - DIALYSIS  150 mcg Intravenous Q Fri-HD  . docusate sodium  100 mg Oral BID  . doxercalciferol  3 mcg Intravenous Q M,W,F-HD  . feeding supplement (NEPRO CARB STEADY)  237 mL Oral BID BM  . heparin injection (subcutaneous)  5,000 Units Subcutaneous Q8H  . insulin aspart  0-5 Units Subcutaneous QHS  . insulin aspart  0-9 Units Subcutaneous TID WC  . insulin glargine  10 Units Subcutaneous QHS  . lactulose  30 g Oral Once  . metoprolol tartrate  25 mg Oral BID  . multivitamin  1 tablet  Oral QHS  . polyethylene glycol  17 g Oral Daily  . sevelamer carbonate  2,400 mg Oral TID WC    Dialysis Orders: MWF [facility] 4h  400/800   82.5kg  2/2.5 bath  Hep 5000   LUA AVF  - hectorol qHD   Assessment/Plan: 1. L distal femur fracture- b/l BKA, s/p ORIF on 4/9 Dr. Eulah Pont. Pain well controlled. D/c to SNF when placed. Per primary.  2. ESRD - HD MWF. HD today.  3. HTN/volume - Bp stable on MTP only. Appears euvolemic on exam. At dry wt.  4. Anemia ckd - Hgb 9.4>8.0>7.8 post OP.Follow trend. Repeat labs preHD. No ESA as OP- started on Aranesp qwk 1st dose 4/12, no IV iron due to allergy.  5. Secondary Hyperparathyroidism- CCa 10.2. P 7.7. Continue VDRA and binders. Changed to 2K/2Ca bath d/t borderline high Ca. 6. Nutrition - Alb 2.5. Renal diet with fluid restrictions. Nepro.  7. DMT2 - on insulin, per primary 8. CAD - cardio consulted for pre op clearance, now signed off. Per primary 9. Dispo - ok from renal standpoint. Will require assistance with transportation from SNF to HD.  Pt is not one of our dialysis patients  [facility]) so we will defer to SW team regarding transportation from SNF to HD in the OP setting.    Vinson Moselle MD [employer] pgr [phone]   10/04/2017, 9:31 AM

## 2017-10-04 NOTE — Care Management (Signed)
Case Management Note  Patient Details  Name: Bobby W Amon MRN: [id number] Date of Birth: 05/10/1943  Subjective/Objective:  76 yr old gentleman s/p ORIF of left femur fracture.                Action/Plan: Patient will go to SNF for shortterm rehab.He will go to [facility] in [place]. Social worker is arranging. Patient is active with [facility].   Expected Discharge Date:    10/04/17              Expected Discharge Plan:  Skilled Nursing Facility  In-House Referral:  Clinical Social Work  Discharge planning Services  CM Consult  Post Acute Care Choice:  NA Choice offered to:  NA  DME Arranged:  N/A DME Agency:  NA  HH Arranged:  NA HH Agency:  [facility]  Status of Service:  Completed, signed off  If discussed at Long Length of Stay Meetings, dates discussed:    Additional Comments:  Syan Cullimore Naomi, RN 10/04/2017, 1:15 PM  

## 2017-10-04 NOTE — Clinical Social Work Placement (Signed)
   CLINICAL SOCIAL WORK PLACEMENT  NOTE  Date:  10/04/2017  Patient Details  Name: Todd RobertsonDaniel Coleman MRN: [id number] Date of Birth: [birth year]  Clinical Social Work is seeking post-discharge placement for this patient at the Skilled  Nursing Facility level of care (*CSW will initial, date and re-position this form in  chart as items are completed):  Yes   Patient/family provided with [facility] Clinical Social Work Department's list of facilities offering this level of care within the geographic area requested by the patient (or if unable, by the patient's family).  Yes   Patient/family informed of their freedom to choose among providers that offer the needed level of care, that participate in Medicare, Medicaid or managed care program needed by the patient, have an available bed and are willing to accept the patient.  Yes   Patient/family informed of [facility]'s ownership interest in [facility] and [facility], as well as of the fact that they are under no obligation to receive care at these facilities.  PASRR submitted to EDS on       PASRR number received on       Existing PASRR number confirmed on 09/30/17     FL2 transmitted to all facilities in geographic area requested by pt/family on       FL2 transmitted to all facilities within larger geographic area on 09/30/17     Patient informed that his/her managed care company has contracts with or will negotiate with certain facilities, including the following:        Yes   Patient/family informed of bed offers received.  Patient chooses bed at [facility])     Physician recommends and patient chooses bed at      Patient to be transferred to [facility]) on 10/04/17.  Patient to be transferred to facility by PTAR     Patient family notified on 10/04/17 of transfer.  Name of family member notified:  pt responsible for self, sister Arline AspCindy contacted     PHYSICIAN       Additional  Comment:    _______________________________________________ Tresa MoorePatricia V Sarim Rothman, LCSW 10/04/2017, 1:35 PM

## 2017-10-04 NOTE — Progress Notes (Signed)
[facility]- answering service only at this time.  A brief message left for the staff to call back for report on this patient.

## 2017-10-04 NOTE — Progress Notes (Signed)
Physical Therapy Treatment Patient Details Name: Todd Coleman Mizner MRN: [id number] DOB: [birth year] Today's Date: 10/04/2017    History of Present Illness Todd Coleman Crunk is a 77 y.o. male with medical history significant of CAD, ESRD on HD MWF, HTN, HLD, DM2, Barrett's esophagus. Pt with Bilateral amputations.  (below the knee). Pt fell out of WC this week.  Pt with broken L femur.  Pt had ORIF 4/9     PT Comments    Pt is very fatigued after having HD today and was unable to attempt slide board transfer OOB to chair due to significant fatigue, decreased ability to keep his eyes open and difficulty sequencing.  He was able to sit up in bed and EOB for quite a while before being positioned back in the bed (as the transporters are due to come get him to take him to SNF for rehab).  PT to follow acutely until d/c confirmed.     Follow Up Recommendations  SNF     Equipment Recommendations  None recommended by PT    Recommendations for Other Services   NA     Precautions / Restrictions Restrictions LLE Weight Bearing: Non weight bearing    Mobility  Bed Mobility Overal bed mobility: Needs Assistance Bed Mobility: Supine to Sit;Sit to Supine;Rolling Rolling: Mod assist;+2 for physical assistance   Supine to sit: Mod assist;+2 for safety/equipment;HOB elevated Sit to supine: +2 for physical assistance;Mod assist(HOB flat)   General bed mobility comments: Pt needed mod assist at trunk and hips to get to sitting EOB.  He had difficulty sequencing and needed multimodal cues for initiation, which direction to go, where to hold.   Transfers                 General transfer comment: Sat EOB for a while attempting to get him to attempt slide board transfer OOB to chair.  He was unable and getting very fatigued with the attempts to scoot, so we decided to have him lay back down.  O2 sats and HR were stable and BP was not checked as he was not reporting lightheadedness or dizziness.         Balance Overall balance assessment: Needs assistance Sitting-balance support: Feet supported;Bilateral upper extremity supported(residual limbs supported ) Sitting balance-Leahy Scale: Poor Sitting balance - Comments: relies heavily on arms to maintain sitting balance EOB.  Postural control: Posterior lean                                  Cognition Arousal/Alertness: Lethargic Behavior During Therapy: WFL for tasks assessed/performed Overall Cognitive Status: Impaired/Different from baseline Area of Impairment: Problem solving;Following commands                       Following Commands: Follows one step commands inconsistently;Follows one step commands with increased time     Problem Solving: Slow processing;Decreased initiation;Difficulty sequencing;Requires verbal cues;Requires tactile cues General Comments: We are seeing pt post HD and he seems very fatigued, hard to keep his eyes open, nodding off while sitting up, increased difficulty with sequencing and command following today compared to last session.              Pertinent Vitals/Pain Pain Assessment: Faces Faces Pain Scale: Hurts whole lot Pain Location: Left leg with movement Pain Descriptors / Indicators: Grimacing;Guarding Pain Intervention(s): Limited activity within patient's tolerance;Monitored during session;Repositioned  PT Goals (current goals can now be found in the care plan section) Acute Rehab PT Goals Patient Stated Goal: go to SNF for rehab before returning to assisted living Progress towards PT goals: Not progressing toward goals - comment(had HD today and it is really wiped him out)    Frequency    Min 3X/week      PT Plan Current plan remains appropriate    Co-evaluation PT/OT/SLP Co-Evaluation/Treatment: Yes Reason for Co-Treatment: Complexity of the patient's impairments (multi-system involvement);Necessary to address cognition/behavior during  functional activity;For patient/therapist safety;To address functional/ADL transfers PT goals addressed during session: Mobility/safety with mobility;Balance;Strengthening/ROM;Proper use of DME        AM-PAC PT "6 Clicks" Daily Activity  Outcome Measure  Difficulty turning over in bed (including adjusting bedclothes, sheets and blankets)?: Unable Difficulty moving from lying on back to sitting on the side of the bed? : Unable Difficulty sitting down on and standing up from a chair with arms (e.g., wheelchair, bedside commode, etc,.)?: Unable Help needed moving to and from a bed to chair (including a wheelchair)?: Total Help needed walking in hospital room?: Total Help needed climbing 3-5 steps with a railing? : Total 6 Click Score: 6    End of Session   Activity Tolerance: Patient limited by fatigue;Patient limited by pain Patient left: in bed;with call bell/phone within reach   PT Visit Diagnosis: Muscle weakness (generalized) (M62.81);Pain;Difficulty in walking, not elsewhere classified (R26.2) Pain - Right/Left: Left Pain - part of body: Leg     Time: 1610-9604 PT Time Calculation (min) (ACUTE ONLY): 39 min  Charges:  $Therapeutic Activity: 23-37 mins          Alyxis Grippi B. Malaney Mcbean, PT, DPT [phone]            10/04/2017, 4:17 PM

## 2017-10-04 NOTE — Progress Notes (Signed)
Occupational Therapy Treatment Patient Details Name: Todd Coleman MRN: [id number] DOB: [birth year] Today's Date: 10/04/2017    History of present illness Todd Coleman is a 77 y.o. male with medical history significant of CAD, ESRD on HD MWF, HTN, HLD, DM2, Barrett's esophagus. Pt with Bilateral amputations.  (below the knee). Pt fell out of WC this week.  Pt with broken L femur.  Pt had ORIF 4/9    OT comments  Pt progressing towards OT goals, though limited by decreased activity tolerance this session. Pt requiring modA+2 for all bed mobility, attempted slide board transfers though pt not able to safely attempt due to fatigue and pt also demonstrating cognitive impairments, having increased difficulty maintaining attention to task and following 1-2 step commands. Feel SNF recommendation remains appropriate; anticipating d/c later today.    Follow Up Recommendations  SNF    Equipment Recommendations  None recommended by OT          Precautions / Restrictions Precautions Precautions: Fall Restrictions Weight Bearing Restrictions: Yes LLE Weight Bearing: Non weight bearing       Mobility Bed Mobility Overal bed mobility: Needs Assistance Bed Mobility: Supine to Sit;Sit to Supine;Rolling Rolling: Mod assist;+2 for physical assistance   Supine to sit: Mod assist;+2 for safety/equipment;HOB elevated Sit to supine: +2 for physical assistance;Mod assist(HOB flat)   General bed mobility comments: Pt needed mod assist at trunk and hips to get to sitting EOB.  He had difficulty sequencing and needed multimodal cues for initiation, which direction to go, where to hold.   Transfers                 General transfer comment: Sat EOB for a while attempting to get him to attempt slide board transfer OOB to chair.  He was unable and getting very fatigued with the attempts to scoot, so we decided to have him lay back down.  O2 sats and HR were stable and BP was not checked as he was  not reporting lightheadedness or dizziness.      Balance Overall balance assessment: Needs assistance Sitting-balance support: Feet supported;Bilateral upper extremity supported(residual limbs supported ) Sitting balance-Leahy Scale: Poor Sitting balance - Comments: relies heavily on arms to maintain sitting balance EOB.  Postural control: Posterior lean                                 ADL either performed or assessed with clinical judgement   ADL Overall ADL's : Needs assistance/impaired     Grooming: Set up;Sitting;Wash/dry face                                 General ADL Comments: pt completing bed mobility, attempted to complete slide board tranfser however pt with fatigue and presenting with decreased cognition, unable to maintain focus on task to safely complete                       Cognition Arousal/Alertness: Lethargic Behavior During Therapy: WFL for tasks assessed/performed Overall Cognitive Status: Impaired/Different from baseline Area of Impairment: Problem solving;Following commands;Attention                   Current Attention Level: Sustained   Following Commands: Follows one step commands inconsistently;Follows one step commands with increased time     Problem Solving: Slow processing;Decreased initiation;Difficulty sequencing;Requires verbal  cues;Requires tactile cues General Comments: We are seeing pt post HD and he seems very fatigued, hard to keep his eyes open, nodding off while sitting up, increased difficulty with sequencing and command following today compared to last session.                           Pertinent Vitals/ Pain       Pain Assessment: Faces Faces Pain Scale: Hurts whole lot Pain Location: Left leg with movement Pain Descriptors / Indicators: Grimacing;Guarding Pain Intervention(s): Limited activity within patient's tolerance;Monitored during session;Repositioned                                                           Frequency  Min 2X/week        Progress Toward Goals  OT Goals(current goals can now be found in the care plan section)  Progress towards OT goals: Progressing toward goals  Acute Rehab OT Goals Patient Stated Goal: go to SNF for rehab before returning to assisted living OT Goal Formulation: With patient Time For Goal Achievement: 10/13/17 Potential to Achieve Goals: Good  Plan Discharge plan remains appropriate    Co-evaluation    PT/OT/SLP Co-Evaluation/Treatment: Yes Reason for Co-Treatment: Complexity of the patient's impairments (multi-system involvement);For patient/therapist safety;To address functional/ADL transfers;Necessary to address cognition/behavior during functional activity PT goals addressed during session: Mobility/safety with mobility;Balance;Strengthening/ROM;Proper use of DME OT goals addressed during session: ADL's and self-care;Strengthening/ROM      AM-PAC PT "6 Clicks" Daily Activity     Outcome Measure   Help from another person eating meals?: None Help from another person taking care of personal grooming?: A Little Help from another person toileting, which includes using toliet, bedpan, or urinal?: Total Help from another person bathing (including washing, rinsing, drying)?: A Lot Help from another person to put on and taking off regular upper body clothing?: A Little Help from another person to put on and taking off regular lower body clothing?: Total 6 Click Score: 14    End of Session    OT Visit Diagnosis: History of falling (Z91.81);Pain Pain - Right/Left: Left Pain - part of body: Leg   Activity Tolerance Patient limited by pain;Patient limited by fatigue   Patient Left in bed;with call bell/phone within reach;with bed alarm set   Nurse Communication Mobility status        Time: [id number] OT Time Calculation (min): 39 min  Charges: OT General Charges $OT Visit: 1  Visit OT Treatments $Therapeutic Activity: 8-22 mins  Marcy SirenBreanna Gaetan Spieker, [place]OT Pager [phone] 10/04/2017    Orlando PennerBreanna L Haylie Mccutcheon 10/04/2017, 4:47 PM

## 2017-10-04 NOTE — Clinical Social Work Placement (Signed)
   CLINICAL SOCIAL WORK PLACEMENT  NOTE  Date:  10/04/2017  Patient Details  Name: Todd RobertsonDaniel Coleman MRN: [id number] Date of Birth: [birth year]  Clinical Social Work is seeking post-discharge placement for this patient at the Skilled  Nursing Facility level of care (*CSW will initial, date and re-position this form in  chart as items are completed):  Yes   Patient/family provided with [facility] Clinical Social Work Department's list of facilities offering this level of care within the geographic area requested by the patient (or if unable, by the patient's family).  Yes   Patient/family informed of their freedom to choose among providers that offer the needed level of care, that participate in Medicare, Medicaid or managed care program needed by the patient, have an available bed and are willing to accept the patient.  Yes   Patient/family informed of [facility]'s ownership interest in [facility] and [facility], as well as of the fact that they are under no obligation to receive care at these facilities.  PASRR submitted to EDS on       PASRR number received on       Existing PASRR number confirmed on 09/30/17     FL2 transmitted to all facilities in geographic area requested by pt/family on       FL2 transmitted to all facilities within larger geographic area on 09/30/17     Patient informed that his/her managed care company has contracts with or will negotiate with certain facilities, including the following:        Yes   Patient/family informed of bed offers received.  Patient chooses bed at [facility])     Physician recommends and patient chooses bed at      Patient to be transferred to [facility]) on 10/04/17.  Patient to be transferred to facility by PTAR     Patient family notified on 10/04/17 of transfer.  Name of family member notified:  pt responsible for self, sister Arline AspCindy contacted     PHYSICIAN       Additional  Comment:    _______________________________________________ Tresa MoorePatricia V Andrya Roppolo, LCSW 10/04/2017, 2:43 PM

## 2017-12-20 DEATH — deceased

## 2018-07-19 IMAGING — DX DG KNEE 1-2V*L*
3 series · 3 of 3 positions shown · non-contrast
Comparison: None available for comparison at time of study
interpretation.

CLINICAL DATA: LEFT hip and knee pain after fall from wheelchair.
History of LEFT hip surgery.

EXAM:
LEFT KNEE - 1-2 VIEW

[knee ap (1 of 2)]
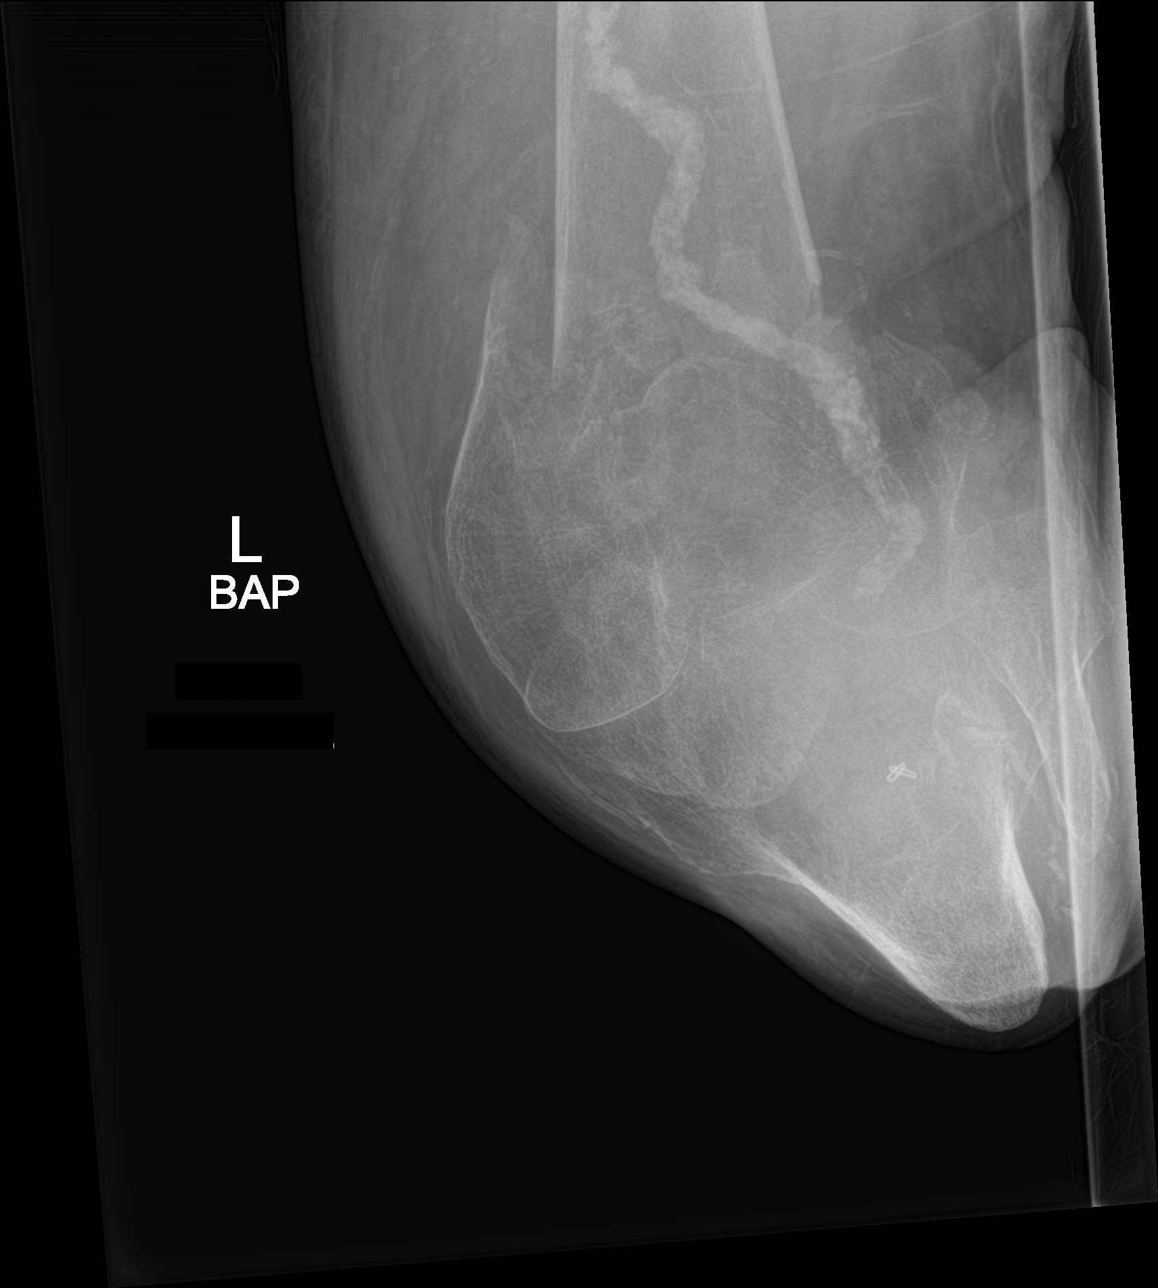

[knee lat]
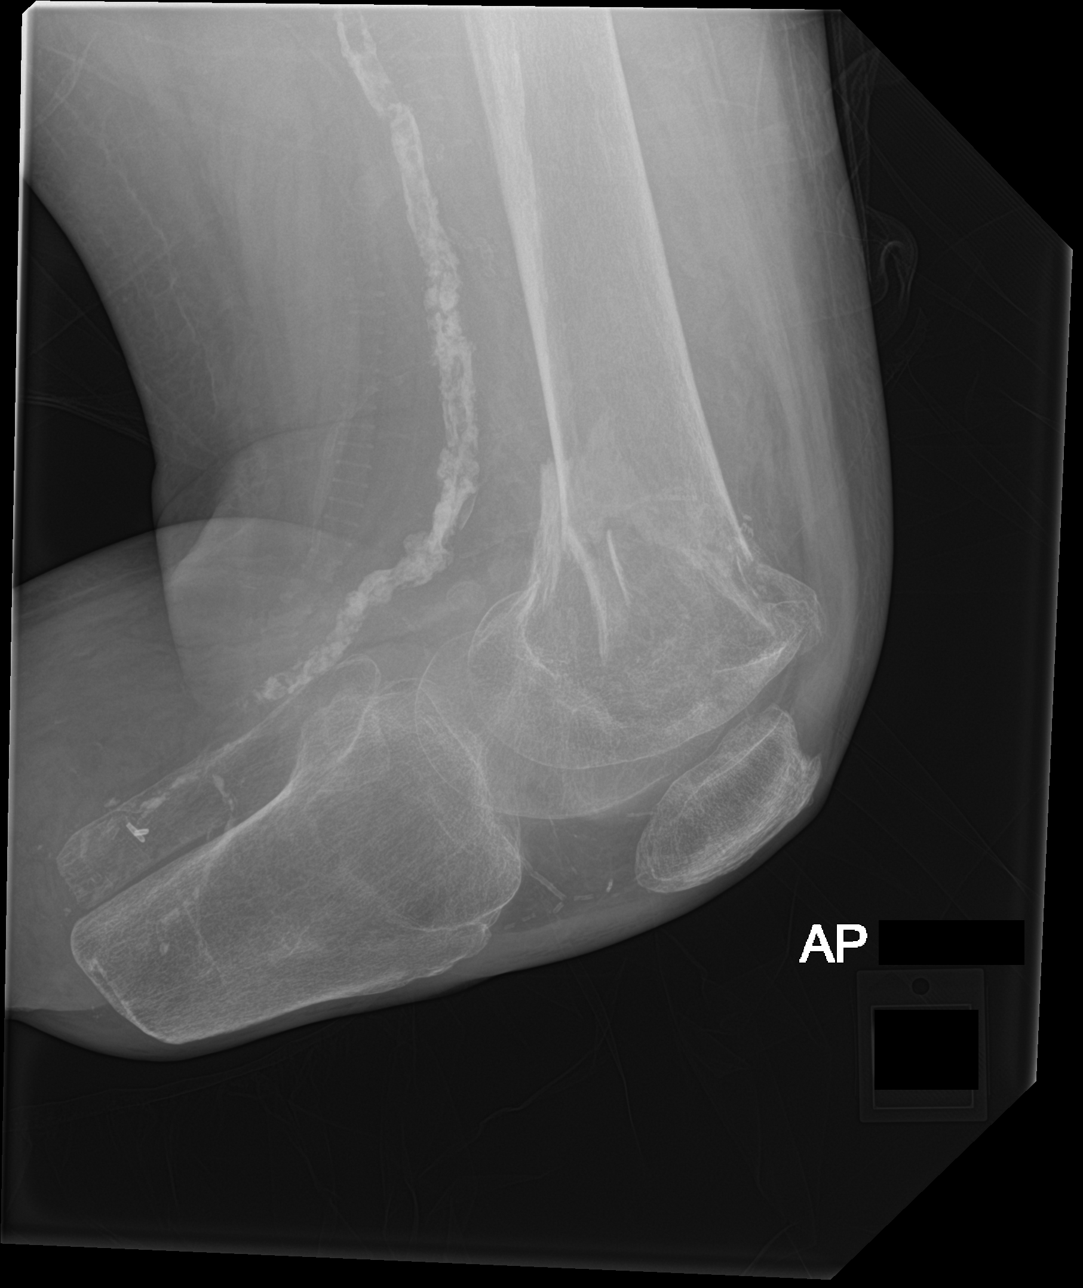

[knee ap (2 of 2)]
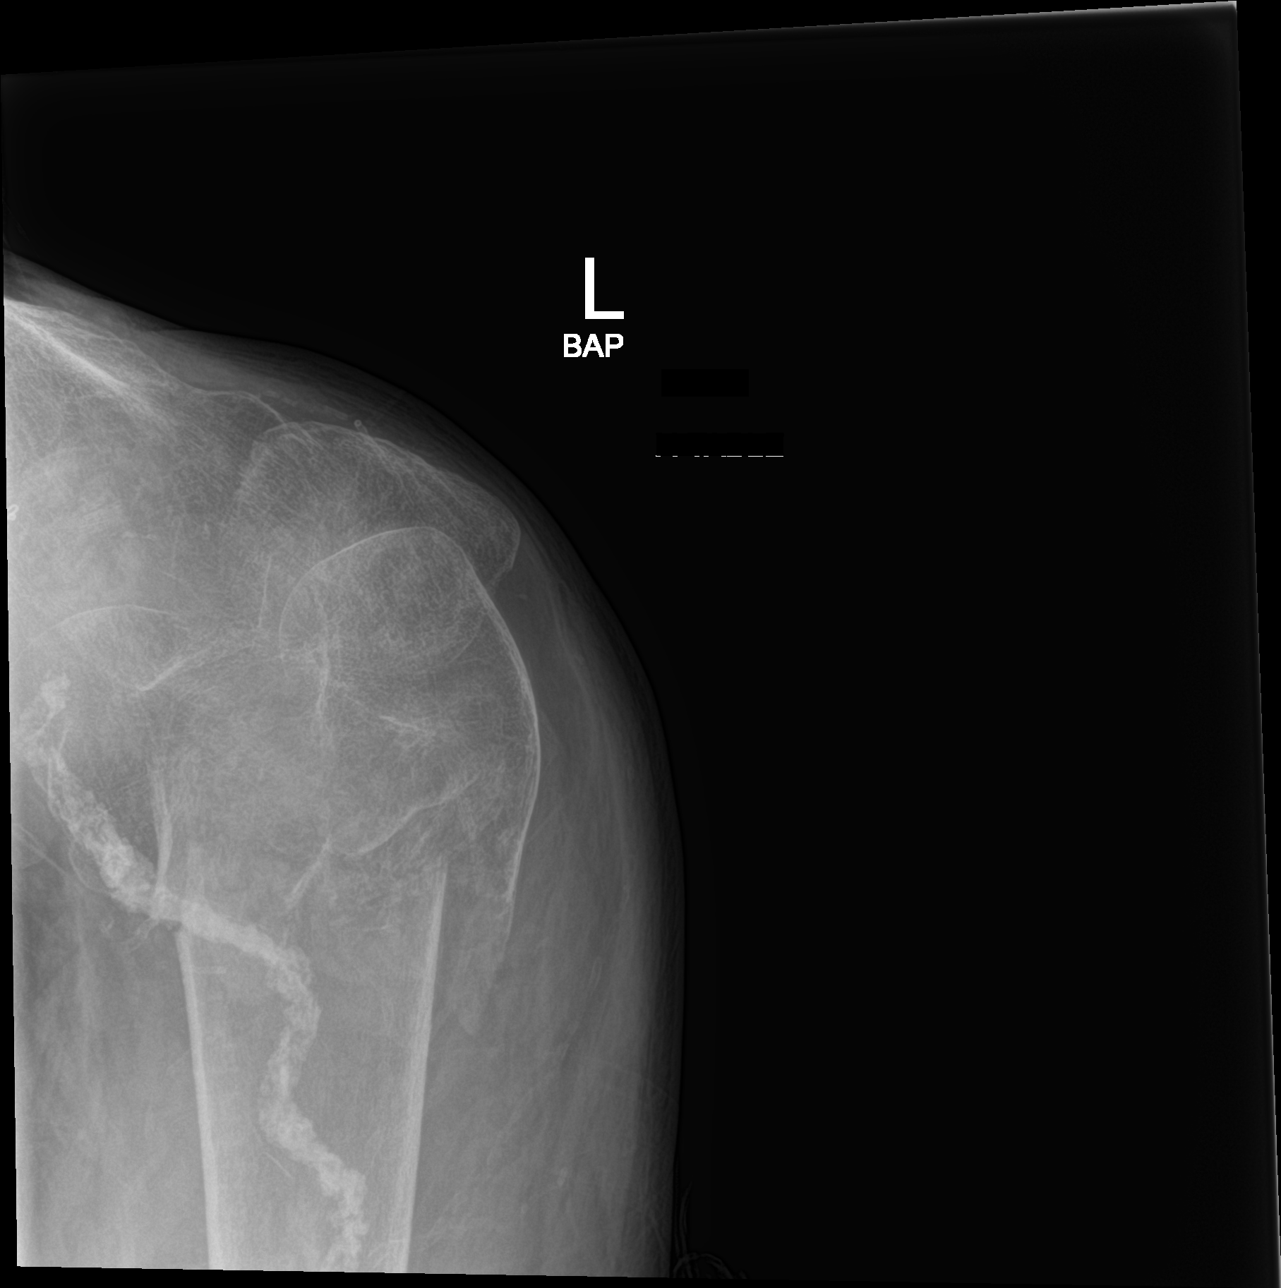

[3 of 3 positions shown; findings below may reference images not displayed]

FINDINGS: Limited assessment due to nonstandard views, per technologist
patient was in pain and had difficulty maintaining position for
imaging. Status post LEFT below-knee amputation. Acute comminuted
impacted femoral metadiaphysis fracture. No definite intra-articular
extension. Osteopenia. No destructive bony lesions. Severe vascular
calcification and stent and popliteal fossa.
IMPRESSION: Acute distal femur fracture.  No dislocation.

Status post below-knee amputation.
